# Patient Record
Sex: Male | Born: 1975 | Race: Black or African American | Hispanic: No | Marital: Single | Smoking: Former smoker
Health system: Southern US, Community
[De-identification: ages and names within clinical notes are randomized; demographics above are authoritative.]

## PROBLEM LIST (undated history)

## (undated) DIAGNOSIS — M62838 Other muscle spasm: Secondary | ICD-10-CM

## (undated) DIAGNOSIS — K0889 Other specified disorders of teeth and supporting structures: Secondary | ICD-10-CM

## (undated) DIAGNOSIS — D573 Sickle-cell trait: Secondary | ICD-10-CM

## (undated) DIAGNOSIS — K219 Gastro-esophageal reflux disease without esophagitis: Secondary | ICD-10-CM

## (undated) DIAGNOSIS — K5900 Constipation, unspecified: Secondary | ICD-10-CM

## (undated) DIAGNOSIS — I639 Cerebral infarction, unspecified: Secondary | ICD-10-CM

## (undated) DIAGNOSIS — K047 Periapical abscess without sinus: Secondary | ICD-10-CM

## (undated) HISTORY — DX: Hypocalcemia: E83.51

## (undated) HISTORY — DX: Other muscle spasm: M62.838

## (undated) HISTORY — DX: Cerebral infarction, unspecified: I63.9

## (undated) HISTORY — DX: Constipation, unspecified: K59.00

---
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
## 2013-03-08 ENCOUNTER — Emergency Department (INDEPENDENT_AMBULATORY_CARE_PROVIDER_SITE_OTHER)
Admission: EM | Admit: 2013-03-08 | Discharge: 2013-03-08 | Disposition: A | Discharge status: Home / Self Care | Payer: Self-pay | Source: Home / Self Care | Attending: Family Medicine | Admitting: Family Medicine

## 2013-03-08 ENCOUNTER — Encounter (HOSPITAL_COMMUNITY): Payer: Self-pay | Admitting: Emergency Medicine

## 2013-03-08 DIAGNOSIS — J019 Acute sinusitis, unspecified: Secondary | ICD-10-CM

## 2013-03-08 DIAGNOSIS — J209 Acute bronchitis, unspecified: Secondary | ICD-10-CM

## 2013-03-08 MED ORDER — LEVOFLOXACIN 500 MG PO TABS: 500.0000 mg | ORAL_TABLET | Freq: Every day | ORAL | Status: DC

## 2013-03-08 NOTE — ED Provider Notes (Signed)
CSN: [id number]     Arrival date & time 03/08/13  1825 History   First MD Initiated Contact with Patient 03/08/13 1955     Chief Complaint  Patient presents with  . URI   (Consider location/radiation/quality/duration/timing/severity/associated sxs/prior Treatment) Patient is a 38 y.o. male presenting with URI. The history is provided by the patient.  URI Presenting symptoms: congestion, cough, fatigue and rhinorrhea   Presenting symptoms: no fever   Severity:  Mild Onset quality:  Gradual Duration:  1 month Progression:  Unchanged Chronicity:  New Risk factors comment:  Smoker   History reviewed. No pertinent past medical history. History reviewed. No pertinent past surgical history. No family history on file. History  Substance Use Topics  . Smoking status: Current Every Day Smoker -- 1.00 packs/day    Types: Cigarettes  . Smokeless tobacco: Not on file  . Alcohol Use: Yes    Review of Systems  Constitutional: Positive for fatigue. Negative for fever.  HENT: Positive for congestion and rhinorrhea.   Respiratory: Positive for cough.   Cardiovascular: Negative.   Gastrointestinal: Negative.     Allergies  Review of patient's allergies indicates no known allergies.  Home Medications   Current Outpatient Rx  Name  Route  Sig  Dispense  Refill  . levofloxacin (LEVAQUIN) 500 MG tablet   Oral   Take 1 tablet (500 mg total) by mouth daily.   7 tablet   0    BP 136/90  Pulse 82  Temp(Src) 98.1 F (36.7 C) (Oral)  Resp 18  SpO2 97% Physical Exam  Nursing note and vitals reviewed. Constitutional: He is oriented to person, place, and time. He appears well-developed and well-nourished. No distress.  HENT:  Head: Normocephalic.  Right Ear: External ear normal.  Left Ear: External ear normal.  Mouth/Throat: Oropharynx is clear and moist.  Eyes: Conjunctivae are normal. Pupils are equal, round, and reactive to light.  Neck: Normal range of motion. Neck supple.   Cardiovascular: Normal rate, regular rhythm, normal heart sounds and intact distal pulses.   Pulmonary/Chest: He has decreased breath sounds. He has no wheezes. He has rhonchi. He has no rales.  Neurological: He is alert and oriented to person, place, and time.  Skin: Skin is warm and dry.    ED Course  Procedures (including critical care time) Labs Review Labs Reviewed - No data to display Imaging Review No results found.  EKG Interpretation    Date/Time:    Ventricular Rate:    PR Interval:    QRS Duration:   QT Interval:    QTC Calculation:   R Axis:     Text Interpretation:              MDM      Linna HoffJames D Ellene Bloodsaw, MD 03/08/13 2112

## 2013-03-08 NOTE — ED Notes (Addendum)
Pt c/o cold sxs onset 1 week Sxs include productive cough, SOB, dizziness, nauseas Denies: wheezing, f/v/d Smokes 1 PPD Taking OTC cold meds w/no relief Alert w/no signs of acute distress.

## 2013-03-08 NOTE — Discharge Instructions (Signed)
Take all of medicine, drink lots of fluids, no more smoking, see your doctor if further problems  °

## 2013-08-24 ENCOUNTER — Emergency Department (HOSPITAL_COMMUNITY): Payer: No Typology Code available for payment source

## 2013-08-24 ENCOUNTER — Emergency Department (HOSPITAL_COMMUNITY)
Admission: EM | Admit: 2013-08-24 | Discharge: 2013-08-24 | Disposition: A | Discharge status: Home / Self Care | Payer: No Typology Code available for payment source | Attending: Emergency Medicine | Admitting: Emergency Medicine

## 2013-08-24 ENCOUNTER — Encounter (HOSPITAL_COMMUNITY): Payer: Self-pay | Admitting: Emergency Medicine

## 2013-08-24 DIAGNOSIS — J069 Acute upper respiratory infection, unspecified: Secondary | ICD-10-CM | POA: Insufficient documentation

## 2013-08-24 DIAGNOSIS — K219 Gastro-esophageal reflux disease without esophagitis: Secondary | ICD-10-CM | POA: Insufficient documentation

## 2013-08-24 DIAGNOSIS — R51 Headache: Secondary | ICD-10-CM | POA: Insufficient documentation

## 2013-08-24 DIAGNOSIS — R05 Cough: Secondary | ICD-10-CM

## 2013-08-24 DIAGNOSIS — F17200 Nicotine dependence, unspecified, uncomplicated: Secondary | ICD-10-CM | POA: Insufficient documentation

## 2013-08-24 DIAGNOSIS — Z79899 Other long term (current) drug therapy: Secondary | ICD-10-CM | POA: Insufficient documentation

## 2013-08-24 DIAGNOSIS — R42 Dizziness and giddiness: Secondary | ICD-10-CM | POA: Insufficient documentation

## 2013-08-24 DIAGNOSIS — R059 Cough, unspecified: Secondary | ICD-10-CM

## 2013-08-24 DIAGNOSIS — R079 Chest pain, unspecified: Secondary | ICD-10-CM | POA: Insufficient documentation

## 2013-08-24 DIAGNOSIS — Z862 Personal history of diseases of the blood and blood-forming organs and certain disorders involving the immune mechanism: Secondary | ICD-10-CM | POA: Insufficient documentation

## 2013-08-24 HISTORY — DX: Sickle-cell trait: D57.3

## 2013-08-24 HISTORY — DX: Gastro-esophageal reflux disease without esophagitis: K21.9

## 2013-08-24 LAB — CBC WITH DIFFERENTIAL/PLATELET
BASOS ABS: 0 10*3/uL (ref 0.0–0.1)
Basophils Relative: 0 % (ref 0–1)
EOS PCT: 3 % (ref 0–5)
Eosinophils Absolute: 0.2 10*3/uL (ref 0.0–0.7)
HEMATOCRIT: 44 % (ref 39.0–52.0)
Hemoglobin: 15.1 g/dL (ref 13.0–17.0)
LYMPHS PCT: 38 % (ref 12–46)
Lymphs Abs: 1.9 10*3/uL (ref 0.7–4.0)
MCH: 29.8 pg (ref 26.0–34.0)
MCHC: 34.3 g/dL (ref 30.0–36.0)
MCV: 86.8 fL (ref 78.0–100.0)
MONO ABS: 0.5 10*3/uL (ref 0.1–1.0)
Monocytes Relative: 10 % (ref 3–12)
NEUTROS ABS: 2.4 10*3/uL (ref 1.7–7.7)
Neutrophils Relative %: 49 % (ref 43–77)
Platelets: 233 10*3/uL (ref 150–400)
RBC: 5.07 MIL/uL (ref 4.22–5.81)
RDW: 13.7 % (ref 11.5–15.5)
WBC: 5 10*3/uL (ref 4.0–10.5)

## 2013-08-24 LAB — BASIC METABOLIC PANEL
ANION GAP: 12 (ref 5–15)
BUN: 7 mg/dL (ref 6–23)
CHLORIDE: 104 meq/L (ref 96–112)
CO2: 27 mEq/L (ref 19–32)
CREATININE: 0.96 mg/dL (ref 0.50–1.35)
Calcium: 9.2 mg/dL (ref 8.4–10.5)
GFR calc Af Amer: >90 mL/min (ref >90)
GFR calc non Af Amer: >90 mL/min (ref >90)
Glucose, Bld: 94 mg/dL (ref 70–99)
Potassium: 4.1 mEq/L (ref 3.7–5.3)
SODIUM: 143 meq/L (ref 137–147)

## 2013-08-24 MED ORDER — ACETAMINOPHEN 500 MG PO TABS
1000.0000 mg | ORAL_TABLET | Freq: Once | ORAL | Status: AC
  Administered 2013-08-24: 1000 mg via ORAL
  Filled 2013-08-24: qty 2

## 2013-08-24 MED ORDER — BENZONATATE 100 MG PO CAPS: 100.0000 mg | ORAL_CAPSULE | Freq: Three times a day (TID) | ORAL | Status: DC | PRN

## 2013-08-24 MED ORDER — SODIUM CHLORIDE 0.9 % IV BOLUS (SEPSIS)
1000.0000 mL | Freq: Once | INTRAVENOUS | Status: AC
  Administered 2013-08-24: 1000 mL via INTRAVENOUS

## 2013-08-24 NOTE — Discharge Instructions (Signed)
Cough, Adult  A cough is a reflex that helps clear your throat and airways. It can help heal the body or may be a reaction to an irritated airway. A cough may only last 2 or 3 weeks (acute) or may last more than 8 weeks (chronic).  CAUSES Acute cough:  Viral or bacterial infections. Chronic cough:  Infections.  Allergies.  Asthma.  Post-nasal drip.  Smoking.  Heartburn or acid reflux.  Some medicines.  Chronic lung problems (COPD).  Cancer. SYMPTOMS   Cough.  Fever.  Chest pain.  Increased breathing rate.  High-pitched whistling sound when breathing (wheezing).  Colored mucus that you cough up (sputum). TREATMENT   A bacterial cough may be treated with antibiotic medicine.  A viral cough must run its course and will not respond to antibiotics.  Your caregiver may recommend other treatments if you have a chronic cough. HOME CARE INSTRUCTIONS   Only take over-the-counter or prescription medicines for pain, discomfort, or fever as directed by your caregiver. Use cough suppressants only as directed by your caregiver.  Use a cold steam vaporizer or humidifier in your bedroom or home to help loosen secretions.  Sleep in a semi-upright position if your cough is worse at night.  Rest as needed.  Stop smoking if you smoke. SEEK IMMEDIATE MEDICAL CARE IF:   You have pus in your sputum.  Your cough starts to worsen.  You cannot control your cough with suppressants and are losing sleep.  You begin coughing up blood.  You have difficulty breathing.  You develop pain which is getting worse or is uncontrolled with medicine.  You have a fever. MAKE SURE YOU:   Understand these instructions.  Will watch your condition.  Will get help right away if you are not doing well or get worse. Document Released: 07/27/2010 Document Revised: 04/22/2011 Document Reviewed: 07/27/2010 [employer] Patient Information 2015 [employer]. This information is not intended  to replace advice given to you by your health care provider. Make sure you discuss any questions you have with your health care provider.  

## 2013-08-24 NOTE — ED Provider Notes (Signed)
CSN: [id number]     Arrival date & time 08/24/13  0803 History   First MD Initiated Contact with Patient 08/24/13 [phone]     Chief Complaint  Patient presents with  . URI  . Dizziness     (Consider location/radiation/quality/duration/timing/severity/associated sxs/prior Treatment) HPI 38 year old male presents with multiple complaints. His primary complaints appear to be a cough for over a week and dizziness for the last several days. The dizziness he describes as feeling hot and feeling like he cannot pass out. This is most often noted when standing up. He first noticed it when he was mowing the lawn a few days ago. The patient relates he's been having a productive cough for over a week. Several days ago he noticed a couple that were tinged with blood but that is resolved. He felt warm like he had a fever but did not take his temperature. He also been having a posterior headache he describes like a pressure. This comes on whenever he coughs. He also adamantly has chest pressure when he coughs. He is not having any symptoms currently. The dizziness is not present currently. Patient also relates he's been having loose stools for last few days. No vomiting. He also relates that he had an insect bite that he thinks a spider on his foot several weeks ago that is resolving. He also got stung by a bee a few days ago but this is also resolving. No prior hx of DVT. No leg swelling.  Past Medical History  Diagnosis Date  . Sickle cell trait   . Acid reflux    No past surgical history on file. No family history on file. History  Substance Use Topics  . Smoking status: Current Every Day Smoker -- 1.00 packs/day for 13 years    Types: Cigarettes  . Smokeless tobacco: Not on file  . Alcohol Use: 2.4 oz/week    4 Cans of beer per week    Review of Systems  Constitutional: Positive for fever (subjective).  HENT: Positive for congestion (nasal). Negative for sore throat.   Eyes: Negative for  photophobia and visual disturbance.  Respiratory: Positive for cough. Negative for shortness of breath.   Cardiovascular: Positive for chest pain. Negative for leg swelling.  Gastrointestinal: Negative for vomiting and abdominal distention.  Neurological: Positive for light-headedness and headaches. Negative for syncope.  All other systems reviewed and are negative.     Allergies  Review of patient's allergies indicates no known allergies.  Home Medications   Prior to Admission medications   Medication Sig Start Date End Date Taking? Authorizing Provider  bismuth subsalicylate (PEPTO BISMOL) 262 MG/15ML suspension Take 30 mLs by mouth every 6 (six) hours as needed for indigestion or diarrhea or loose stools.   Yes Historical Provider, MD  calcium carbonate (TUMS - DOSED IN MG ELEMENTAL CALCIUM) 500 MG chewable tablet Chew 0.5-1 tablets by mouth daily as needed for indigestion or heartburn.   Yes Historical Provider, MD   BP 131/84  Pulse 57  Temp(Src) 98.3 F (36.8 C) (Oral)  Resp 20  Ht 5\' 9"  (1.753 m)  Wt 180 lb (81.647 kg)  BMI 26.57 kg/m2  SpO2 98% Physical Exam  Nursing note and vitals reviewed. Constitutional: He is oriented to person, place, and time. He appears well-developed and well-nourished. No distress.  HENT:  Head: Normocephalic and atraumatic.  Right Ear: External ear normal.  Left Ear: External ear normal.  Nose: Nose normal.  No tenderness to scalp or neck  Eyes:  EOM are normal. Pupils are equal, round, and reactive to light. Right eye exhibits no discharge. Left eye exhibits no discharge.  Neck: Normal range of motion. Neck supple.  Cardiovascular: Normal rate, regular rhythm, normal heart sounds and intact distal pulses.   Pulmonary/Chest: Effort normal and breath sounds normal.  Abdominal: Soft. There is no tenderness.  Musculoskeletal: He exhibits no edema.  Neurological: He is alert and oriented to person, place, and time.  CN 2-12 grossly intact.  5/5 strength in all 4 extremities. Normal finger to nose testing. Normal gross sensation diffusely  Skin: Skin is warm and dry.       ED Course  Procedures (including critical care time) Labs Review Labs Reviewed  CBC WITH DIFFERENTIAL  BASIC METABOLIC PANEL    Imaging Review Dg Chest 2 View  08/24/2013   CLINICAL DATA:  Cough, smoker  EXAM: CHEST  2 VIEW  COMPARISON:  None.  FINDINGS: Lungs essentially clear. Mildly scarring in the right lower lung. No pleural effusion or pneumothorax.  The heart is normal in size.  Visualized osseous structures are within normal limits.  IMPRESSION: No evidence of acute cardiopulmonary disease.   Electronically Signed   By: Charline BillsSriyesh  Krishnan M.D.   On: 08/24/2013 10:00     EKG Interpretation   Date/Time:  Tuesday August 24 2013 08:16:08 EDT Ventricular Rate:  70 PR Interval:  152 QRS Duration: 87 QT Interval:  393 QTC Calculation: 424 R Axis:   16 Text Interpretation:  Sinus arrhythmia Early repolarization No old tracing  to compare Confirmed by Ashtan Girtman  MD, Devan Babino ([id number]) on 08/24/2013 9:02:41 AM      MDM   Final diagnoses:  Cough  URI (upper respiratory infection)  Dizziness    Patient's symptoms are not c/w PE, ACS or dissection. His CP appears related to his coughing. Headache appears benign, has normal neuro exam, intermittent symptoms only when coughing and gradual onset and offset. Low suspicion for serious intracranial pathology. CXR unremarkable. Labs unremarkable. Dizziness likely related to his URI and/or diarrhea as he is orthostatic on exam by HR. Given IV fluids, encouraged increased fluid intake at home. His exam is otherwise unconcerning. Counseled on smoking cessation    Audree CamelScott T Damel Querry, MD 08/24/13 1041

## 2013-08-24 NOTE — ED Notes (Signed)
IV removed from rt arm, tape applied with gauze.

## 2013-08-24 NOTE — ED Notes (Signed)
Dizziness constantly since Friday, reports upper congestions as well as coughing that makes his dizziness worse. Reports loose watery stools and gas that causes him to be incontinent of stool at times for about a week.  Also c/o of indigestion.  Productive cough with clear and blood tinged at first but not currently.

## 2013-11-08 ENCOUNTER — Emergency Department (HOSPITAL_COMMUNITY)
Admission: EM | Admit: 2013-11-08 | Discharge: 2013-11-08 | Disposition: A | Discharge status: Home / Self Care | Payer: No Typology Code available for payment source | Attending: Emergency Medicine | Admitting: Emergency Medicine

## 2013-11-08 ENCOUNTER — Emergency Department (HOSPITAL_COMMUNITY): Payer: No Typology Code available for payment source

## 2013-11-08 ENCOUNTER — Encounter (HOSPITAL_COMMUNITY): Payer: Self-pay | Admitting: Emergency Medicine

## 2013-11-08 DIAGNOSIS — X503XXA Overexertion from repetitive movements, initial encounter: Secondary | ICD-10-CM | POA: Insufficient documentation

## 2013-11-08 DIAGNOSIS — F17200 Nicotine dependence, unspecified, uncomplicated: Secondary | ICD-10-CM | POA: Diagnosis not present

## 2013-11-08 DIAGNOSIS — S298XXA Other specified injuries of thorax, initial encounter: Secondary | ICD-10-CM | POA: Diagnosis present

## 2013-11-08 DIAGNOSIS — X500XXA Overexertion from strenuous movement or load, initial encounter: Secondary | ICD-10-CM | POA: Diagnosis not present

## 2013-11-08 DIAGNOSIS — Y9389 Activity, other specified: Secondary | ICD-10-CM | POA: Insufficient documentation

## 2013-11-08 DIAGNOSIS — K219 Gastro-esophageal reflux disease without esophagitis: Secondary | ICD-10-CM | POA: Insufficient documentation

## 2013-11-08 DIAGNOSIS — Y9289 Other specified places as the place of occurrence of the external cause: Secondary | ICD-10-CM | POA: Diagnosis not present

## 2013-11-08 DIAGNOSIS — Y990 Civilian activity done for income or pay: Secondary | ICD-10-CM | POA: Diagnosis not present

## 2013-11-08 DIAGNOSIS — Z862 Personal history of diseases of the blood and blood-forming organs and certain disorders involving the immune mechanism: Secondary | ICD-10-CM | POA: Insufficient documentation

## 2013-11-08 DIAGNOSIS — S29011A Strain of muscle and tendon of front wall of thorax, initial encounter: Secondary | ICD-10-CM

## 2013-11-08 DIAGNOSIS — S2341XA Sprain of ribs, initial encounter: Secondary | ICD-10-CM | POA: Diagnosis not present

## 2013-11-08 MED ORDER — TRAMADOL HCL 50 MG PO TABS: 50.0000 mg | ORAL_TABLET | Freq: Four times a day (QID) | ORAL | Status: DC | PRN

## 2013-11-08 MED ORDER — IBUPROFEN 400 MG PO TABS
400.0000 mg | ORAL_TABLET | Freq: Once | ORAL | Status: AC
  Administered 2013-11-08: 400 mg via ORAL
  Filled 2013-11-08: qty 1

## 2013-11-08 NOTE — ED Notes (Signed)
Declined W/C at D/C and was escorted to lobby by RN. 

## 2013-11-08 NOTE — ED Provider Notes (Signed)
CSN: [id number]     Arrival date & time 11/08/13  1100 History   First MD Initiated Contact with Patient 11/08/13 1156     Chief Complaint  Patient presents with  . Chest Injury     (Consider location/radiation/quality/duration/timing/severity/associated sxs/prior Treatment) The history is provided by the patient.  pt states was at work this morning, lifting a box of melons, when felt sudden pop/pull, and pain right lower chest just left of midline.  Pain constant since. Dull. Moderate. Worse w palpation and certain movements. No sob. No other recent cp or discomfort even w exertion. No associated nv, diaphoresis or sob. No cough or uri c/o. No fever or chills. No pleuritic pain.      Past Medical History  Diagnosis Date  . Sickle cell trait   . Acid reflux    History reviewed. No pertinent past surgical history. No family history on file. History  Substance Use Topics  . Smoking status: Current Every Day Smoker -- 1.00 packs/day for 13 years    Types: Cigarettes  . Smokeless tobacco: Not on file  . Alcohol Use: 2.4 oz/week    4 Cans of beer per week    Review of Systems  Constitutional: Negative for fever.  HENT: Negative for sore throat.   Eyes: Negative for redness.  Respiratory: Negative for shortness of breath.   Cardiovascular: Negative for leg swelling.  Gastrointestinal: Negative for vomiting and abdominal pain.  Genitourinary: Negative for flank pain.  Musculoskeletal: Negative for back pain and neck pain.  Skin: Negative for rash.  Neurological: Negative for headaches.  Hematological: Does not bruise/bleed easily.  Psychiatric/Behavioral: Negative for confusion.      Allergies  Review of patient's allergies indicates no known allergies.  Home Medications   Prior to Admission medications   Medication Sig Start Date End Date Taking? Authorizing Provider  benzonatate (TESSALON) 100 MG capsule Take 1 capsule (100 mg total) by mouth 3 (three) times daily  as needed for cough. 08/24/13   Audree Camel, MD  bismuth subsalicylate (PEPTO BISMOL) 262 MG/15ML suspension Take 30 mLs by mouth every 6 (six) hours as needed for indigestion or diarrhea or loose stools.    Historical Provider, MD  calcium carbonate (TUMS - DOSED IN MG ELEMENTAL CALCIUM) 500 MG chewable tablet Chew 0.5-1 tablets by mouth daily as needed for indigestion or heartburn.    Historical Provider, MD   BP 130/66  Pulse 65  Temp(Src) 98.6 F (37 C) (Oral)  Resp 16  Ht  (1.753 m)  Wt 185 lb (83.915 kg)  BMI 27.31 kg/m2  SpO2 97% Physical Exam  Nursing note and vitals reviewed. Constitutional: He is oriented to person, place, and time. He appears well-developed and well-nourished. No distress.  HENT:  Mouth/Throat: Oropharynx is clear and moist.  Eyes: Conjunctivae are normal.  Neck: Neck supple. No tracheal deviation present.  Cardiovascular: Normal rate, regular rhythm, normal heart sounds and intact distal pulses.  Exam reveals no gallop and no friction rub.   No murmur heard. Pulmonary/Chest: Effort normal and breath sounds normal. No accessory muscle usage. No respiratory distress. He exhibits tenderness.  Localized chest wall tenderness reproducing pts symptoms. Normal chest wall movement. No crepitus.    Abdominal: Soft. Bowel sounds are normal. He exhibits no distension. There is no tenderness.  Musculoskeletal: Normal range of motion. He exhibits no edema and no tenderness.  Neurological: He is alert and oriented to person, place, and time.  Skin: Skin is warm and  dry. He is not diaphoretic.  Psychiatric: He has a normal mood and affect.    ED Course  Procedures (including critical care time) Labs Review  Dg Chest 2 View  11/08/2013   CLINICAL DATA:  Lifting today and felt a pull in the upper chest. Shortness of breath while bending. History of smoking.  EXAM: CHEST  2 VIEW  COMPARISON:  08/24/2013  FINDINGS: The heart size and mediastinal contours are  within normal limits. Both lungs are clear. The visualized skeletal structures are unremarkable.  IMPRESSION: No active cardiopulmonary disease.   Electronically Signed   By: Rosalie Gums M.D.   On: 11/08/2013 12:23     MDM   Motrin po.  Cxr.  Reviewed nursing notes and prior charts for additional history.   Recheck pt comfortable and stable for d/c.  Discussed xrays and d/c plan w pt.     Suzi Roots, MD 11/09/13 1126

## 2013-11-08 NOTE — ED Notes (Signed)
Pt currently in xray

## 2013-11-08 NOTE — Discharge Instructions (Signed)
It was our pleasure to provide your ER care today - we hope that you feel better.  Take motrin or aleve as need for pain. You may also take ultram as need for pain - no driving when taking ultram. Follow up with primary care doctor in 1-2 weeks if symptoms fail to improve/resolve.  Return to ER if worse, new symptoms, trouble breathing, other concern.      Chest Wall Pain Chest wall pain is pain in or around the bones and muscles of your chest. It may take up to 6 weeks to get better. It may take longer if you must stay physically active in your work and activities.  CAUSES  Chest wall pain may happen on its own. However, it may be caused by:  A viral illness like the flu.  Injury.  Coughing.  Exercise.  Arthritis.  Fibromyalgia.  Shingles. HOME CARE INSTRUCTIONS   Avoid overtiring physical activity. Try not to strain or perform activities that cause pain. This includes any activities using your chest or your abdominal and side muscles, especially if heavy weights are used.  Put ice on the sore area.  Put ice in a plastic bag.  Place a towel between your skin and the bag.  Leave the ice on for 15-20 minutes per hour while awake for the first 2 days.  Only take over-the-counter or prescription medicines for pain, discomfort, or fever as directed by your caregiver. SEEK IMMEDIATE MEDICAL CARE IF:   Your pain increases, or you are very uncomfortable.  You have a fever.  Your chest pain becomes worse.  You have new, unexplained symptoms.  You have nausea or vomiting.  You feel sweaty or lightheaded.  You have a cough with phlegm (sputum), or you cough up blood. MAKE SURE YOU:   Understand these instructions.  Will watch your condition.  Will get help right away if you are not doing well or get worse. Document Released: 01/28/2005 Document Revised: 04/22/2011 Document Reviewed: 09/24/2010 [facility] Patient Information 2015 [place], [place]. This information  is not intended to replace advice given to you by your health care provider. Make sure you discuss any questions you have with your health care provider.     Costochondritis Costochondritis, sometimes called Tietze syndrome, is a swelling and irritation (inflammation) of the tissue (cartilage) that connects your ribs with your breastbone (sternum). It causes pain in the chest and rib area. Costochondritis usually goes away on its own over time. It can take up to 6 weeks or longer to get better, especially if you are unable to limit your activities. CAUSES  Some cases of costochondritis have no known cause. Possible causes include:  Injury (trauma).  Exercise or activity such as lifting.  Severe coughing. SIGNS AND SYMPTOMS  Pain and tenderness in the chest and rib area.  Pain that gets worse when coughing or taking deep breaths.  Pain that gets worse with specific movements. DIAGNOSIS  Your health care provider will do a physical exam and ask about your symptoms. Chest X-rays or other tests may be done to rule out other problems. TREATMENT  Costochondritis usually goes away on its own over time. Your health care provider may prescribe medicine to help relieve pain. HOME CARE INSTRUCTIONS   Avoid exhausting physical activity. Try not to strain your ribs during normal activity. This would include any activities using chest, abdominal, and side muscles, especially if heavy weights are used.  Apply ice to the affected area for the first 2 days  after the pain begins.  Put ice in a plastic bag.  Place a towel between your skin and the bag.  Leave the ice on for 20 minutes, 2-3 times a day.  Only take over-the-counter or prescription medicines as directed by your health care provider. SEEK MEDICAL CARE IF:  You have redness or swelling at the rib joints. These are signs of infection.  Your pain does not go away despite rest or medicine. SEEK IMMEDIATE MEDICAL CARE IF:   Your  pain increases or you are very uncomfortable.  You have shortness of breath or difficulty breathing.  You cough up blood.  You have worse chest pains, sweating, or vomiting.  You have a fever or persistent symptoms for more than 2-3 days.  You have a fever and your symptoms suddenly get worse. MAKE SURE YOU:   Understand these instructions.  Will watch your condition.  Will get help right away if you are not doing well or get worse. Document Released: 11/07/2004 Document Revised: 11/18/2012 Document Reviewed: 09/01/2012 [facility] Patient Information 2015 [place], [place]. This information is not intended to replace advice given to you by your health care provider. Make sure you discuss any questions you have with your health care provider.

## 2013-11-08 NOTE — ED Notes (Signed)
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 Not sure if he pulled something,.

## 2013-11-08 NOTE — ED Notes (Signed)
Pt was lifting a 40lb object, felt "pop" in right chest. States it now hurts to move and deep breath. Breath sounds present and clear.

## 2014-02-06 ENCOUNTER — Encounter (HOSPITAL_COMMUNITY): Payer: Self-pay | Admitting: Nurse Practitioner

## 2014-02-06 ENCOUNTER — Emergency Department (HOSPITAL_COMMUNITY)
Admission: EM | Admit: 2014-02-06 | Discharge: 2014-02-06 | Disposition: A | Discharge status: Home / Self Care | Payer: No Typology Code available for payment source | Attending: Emergency Medicine | Admitting: Emergency Medicine

## 2014-02-06 DIAGNOSIS — R109 Unspecified abdominal pain: Secondary | ICD-10-CM | POA: Diagnosis present

## 2014-02-06 DIAGNOSIS — Z720 Tobacco use: Secondary | ICD-10-CM | POA: Insufficient documentation

## 2014-02-06 DIAGNOSIS — R1084 Generalized abdominal pain: Secondary | ICD-10-CM | POA: Diagnosis not present

## 2014-02-06 DIAGNOSIS — K219 Gastro-esophageal reflux disease without esophagitis: Secondary | ICD-10-CM | POA: Diagnosis not present

## 2014-02-06 DIAGNOSIS — Z862 Personal history of diseases of the blood and blood-forming organs and certain disorders involving the immune mechanism: Secondary | ICD-10-CM | POA: Insufficient documentation

## 2014-02-06 DIAGNOSIS — R195 Other fecal abnormalities: Secondary | ICD-10-CM | POA: Diagnosis not present

## 2014-02-06 LAB — COMPREHENSIVE METABOLIC PANEL
ALT: 30 U/L (ref 0–53)
ANION GAP: 9 (ref 5–15)
AST: 29 U/L (ref 0–37)
Albumin: 4.7 g/dL (ref 3.5–5.2)
Alkaline Phosphatase: 95 U/L (ref 39–117)
BILIRUBIN TOTAL: 0.3 mg/dL (ref 0.3–1.2)
BUN: 10 mg/dL (ref 6–23)
CHLORIDE: 102 meq/L (ref 96–112)
CO2: 26 mmol/L (ref 19–32)
CREATININE: 0.98 mg/dL (ref 0.50–1.35)
Calcium: 9.9 mg/dL (ref 8.4–10.5)
GLUCOSE: 99 mg/dL (ref 70–99)
Potassium: 4.4 mmol/L (ref 3.5–5.1)
Sodium: 137 mmol/L (ref 135–145)
Total Protein: 7.7 g/dL (ref 6.0–8.3)

## 2014-02-06 LAB — URINALYSIS, ROUTINE W REFLEX MICROSCOPIC
Bilirubin Urine: NEGATIVE
Glucose, UA: NEGATIVE mg/dL
Hgb urine dipstick: NEGATIVE
KETONES UR: NEGATIVE mg/dL
LEUKOCYTES UA: NEGATIVE
Nitrite: NEGATIVE
PROTEIN: NEGATIVE mg/dL
Specific Gravity, Urine: 1.018 (ref 1.005–1.030)
UROBILINOGEN UA: 0.2 mg/dL (ref 0.0–1.0)
pH: 6 (ref 5.0–8.0)

## 2014-02-06 LAB — CBC WITH DIFFERENTIAL/PLATELET
Basophils Absolute: 0 10*3/uL (ref 0.0–0.1)
Basophils Relative: 0 % (ref 0–1)
Eosinophils Absolute: 0.1 10*3/uL (ref 0.0–0.7)
Eosinophils Relative: 2 % (ref 0–5)
HCT: 46.1 % (ref 39.0–52.0)
HEMOGLOBIN: 16.2 g/dL (ref 13.0–17.0)
LYMPHS ABS: 2.5 10*3/uL (ref 0.7–4.0)
LYMPHS PCT: 39 % (ref 12–46)
MCH: 29.6 pg (ref 26.0–34.0)
MCHC: 35.1 g/dL (ref 30.0–36.0)
MCV: 84.1 fL (ref 78.0–100.0)
MONOS PCT: 9 % (ref 3–12)
Monocytes Absolute: 0.6 10*3/uL (ref 0.1–1.0)
NEUTROS ABS: 3.3 10*3/uL (ref 1.7–7.7)
Neutrophils Relative %: 50 % (ref 43–77)
Platelets: 248 10*3/uL (ref 150–400)
RBC: 5.48 MIL/uL (ref 4.22–5.81)
RDW: 13 % (ref 11.5–15.5)
WBC: 6.4 10*3/uL (ref 4.0–10.5)

## 2014-02-06 LAB — POC OCCULT BLOOD, ED: Fecal Occult Bld: NEGATIVE

## 2014-02-06 MED ORDER — OXYCODONE-ACETAMINOPHEN 5-325 MG PO TABS: 1.0000 | ORAL_TABLET | Freq: Four times a day (QID) | ORAL | Status: DC | PRN

## 2014-02-06 MED ORDER — LANSOPRAZOLE 30 MG PO CPDR: 30.0000 mg | DELAYED_RELEASE_CAPSULE | Freq: Every day | ORAL | Status: DC

## 2014-02-06 NOTE — Discharge Instructions (Signed)
Return to the ED with any concerns including vomiting and not able to keep down liquids, worsening abdominal pain, fever/chills, fainting, decreased level of alertness/lethargy, or any other alarming symptoms °

## 2014-02-06 NOTE — ED Provider Notes (Signed)
CSN: [id number]     Arrival date & time 02/06/14  1647 History   First MD Initiated Contact with Patient 02/06/14 1849     Chief Complaint  Patient presents with  . Abdominal Pain     (Consider location/radiation/quality/duration/timing/severity/associated sxs/prior Treatment) HPI  Pt with hx of GERD reports symptoms of abdominal pain, bloating, gas as well as dark colored stools.  He states the change in stools occurred today- took pepto bismol for the past 2 days.  No fever, no vomiting.  No bright red blood in stool.  Pt setates he has taken multiple meds- tums, rolaids, bean, nexium all wtihout relief of symptoms.   No fever/chills.  Denies dysuria.  There are no other associated systemic symptoms, there are no other alleviating or modifying factors.   Past Medical History  Diagnosis Date  . Sickle cell trait   . Acid reflux    History reviewed. No pertinent past surgical history. History reviewed. No pertinent family history. History  Substance Use Topics  . Smoking status: Current Every Day Smoker -- 1.00 packs/day for 13 years    Types: Cigarettes  . Smokeless tobacco: Not on file  . Alcohol Use: 2.4 oz/week    4 Cans of beer per week    Review of Systems  ROS reviewed and all otherwise negative except for mentioned in HPI    Allergies  Review of patient's allergies indicates no known allergies.  Home Medications   Prior to Admission medications   Medication Sig Start Date End Date Taking? Authorizing Provider  lansoprazole (PREVACID) 30 MG capsule Take 1 capsule (30 mg total) by mouth daily at 12 noon. 02/06/14   Ethelda ChickMartha K Linker, MD  oxyCODONE-acetaminophen (PERCOCET/ROXICET) 5-325 MG per tablet Take 1-2 tablets by mouth every 6 (six) hours as needed for severe pain. 02/06/14   Ethelda ChickMartha K Linker, MD  traMADol (ULTRAM) 50 MG tablet Take 1 tablet (50 mg total) by mouth every 6 (six) hours as needed. Patient not taking: Reported on 02/06/2014 11/08/13   Suzi RootsKevin E Steinl,  MD   BP 130/75 mmHg  Pulse 68  Temp(Src) 98.2 F (36.8 C) (Oral)  Resp 17  Ht 5\' 10"  (1.778 m)  Wt 183 lb (83.008 kg)  BMI 26.26 kg/m2  SpO2 97%  Vitals reviewed Physical Exam  Physical Examination: General appearance - alert, well appearing, and in no distress Mental status - alert, oriented to person, place, and time Eyes - no conjunctival injection, no scleral icterus Mouth - mucous membranes moist, pharynx normal without lesions Chest - clear to auscultation, no wheezes, rales or rhonchi, symmetric air entry Heart - normal rate, regular rhythm, normal S1, S2, no murmurs, rubs, clicks or gallops Abdomen - soft, mild diffuse tenderness to palpation, no gaurding or rebound, nondistended, no masses or organomegaly Extremities - peripheral pulses normal, no pedal edema, no clubbing or cyanosis Skin - normal coloration and turgor, no rashes  ED Course  Procedures (including critical care time) Labs Review Labs Reviewed  CBC WITH DIFFERENTIAL  COMPREHENSIVE METABOLIC PANEL  URINALYSIS, ROUTINE W REFLEX MICROSCOPIC  POC OCCULT BLOOD, ED    Imaging Review No results found.   EKG Interpretation None      MDM   Final diagnoses:  Generalized abdominal pain    Pt presenting with diffuse abdominal pain, no vomiting or diarrhea.  hemocult negative stool- dark color likely related to pepto bismol use.  Abdominal exam is benign.  No indication for imaging on an emergent basis.  Pt  given rx for prevacid and advised GI followup.  Discharged with strict return precautions.  Pt agreeable with plan.    Ethelda ChickMartha K Linker, MD 02/10/14 [phone]

## 2014-02-06 NOTE — ED Notes (Signed)
Pt a/o x 4 on d/c with friend. 

## 2014-02-06 NOTE — ED Notes (Signed)
Pt reports abd pain, bloating, gas and dark colored stools over past month. He took laxatives, nexium, pepto bismol, beano, rolaids with no relief of symptoms. Reports nausea but no vomiting

## 2014-03-07 ENCOUNTER — Emergency Department (HOSPITAL_COMMUNITY): Payer: No Typology Code available for payment source

## 2014-03-07 ENCOUNTER — Encounter (HOSPITAL_COMMUNITY): Payer: Self-pay | Admitting: *Deleted

## 2014-03-07 ENCOUNTER — Emergency Department (HOSPITAL_COMMUNITY)
Admission: EM | Admit: 2014-03-07 | Discharge: 2014-03-07 | Disposition: A | Discharge status: Home / Self Care | Payer: No Typology Code available for payment source | Attending: Emergency Medicine | Admitting: Emergency Medicine

## 2014-03-07 DIAGNOSIS — Z720 Tobacco use: Secondary | ICD-10-CM | POA: Insufficient documentation

## 2014-03-07 DIAGNOSIS — R079 Chest pain, unspecified: Secondary | ICD-10-CM | POA: Insufficient documentation

## 2014-03-07 DIAGNOSIS — K219 Gastro-esophageal reflux disease without esophagitis: Secondary | ICD-10-CM | POA: Diagnosis not present

## 2014-03-07 DIAGNOSIS — Z862 Personal history of diseases of the blood and blood-forming organs and certain disorders involving the immune mechanism: Secondary | ICD-10-CM | POA: Insufficient documentation

## 2014-03-07 DIAGNOSIS — R51 Headache: Secondary | ICD-10-CM | POA: Insufficient documentation

## 2014-03-07 DIAGNOSIS — R0602 Shortness of breath: Secondary | ICD-10-CM | POA: Diagnosis present

## 2014-03-07 DIAGNOSIS — Z79891 Long term (current) use of opiate analgesic: Secondary | ICD-10-CM | POA: Diagnosis not present

## 2014-03-07 DIAGNOSIS — R05 Cough: Secondary | ICD-10-CM | POA: Insufficient documentation

## 2014-03-07 DIAGNOSIS — R0981 Nasal congestion: Secondary | ICD-10-CM | POA: Insufficient documentation

## 2014-03-07 DIAGNOSIS — Z79899 Other long term (current) drug therapy: Secondary | ICD-10-CM | POA: Diagnosis not present

## 2014-03-07 DIAGNOSIS — R109 Unspecified abdominal pain: Secondary | ICD-10-CM | POA: Diagnosis not present

## 2014-03-07 LAB — I-STAT TROPONIN, ED: Troponin i, poc: 0 ng/mL (ref 0.00–0.08)

## 2014-03-07 LAB — COMPREHENSIVE METABOLIC PANEL
ALT: 31 U/L (ref 0–53)
ANION GAP: 5 (ref 5–15)
AST: 32 U/L (ref 0–37)
Albumin: 4 g/dL (ref 3.5–5.2)
Alkaline Phosphatase: 92 U/L (ref 39–117)
BUN: 8 mg/dL (ref 6–23)
CALCIUM: 9.4 mg/dL (ref 8.4–10.5)
CO2: 31 mmol/L (ref 19–32)
CREATININE: 1.03 mg/dL (ref 0.50–1.35)
Chloride: 106 mmol/L (ref 96–112)
GFR calc non Af Amer: >90 mL/min (ref >90)
GLUCOSE: 111 mg/dL — AB (ref 70–99)
Potassium: 4.2 mmol/L (ref 3.5–5.1)
SODIUM: 142 mmol/L (ref 135–145)
TOTAL PROTEIN: 6.8 g/dL (ref 6.0–8.3)
Total Bilirubin: 0.6 mg/dL (ref 0.3–1.2)

## 2014-03-07 LAB — CBC WITH DIFFERENTIAL/PLATELET
BASOS ABS: 0 10*3/uL (ref 0.0–0.1)
BASOS PCT: 1 % (ref 0–1)
EOS PCT: 3 % (ref 0–5)
Eosinophils Absolute: 0.2 10*3/uL (ref 0.0–0.7)
HCT: 43.4 % (ref 39.0–52.0)
Hemoglobin: 15.1 g/dL (ref 13.0–17.0)
LYMPHS PCT: 41 % (ref 12–46)
Lymphs Abs: 2.2 10*3/uL (ref 0.7–4.0)
MCH: 29.6 pg (ref 26.0–34.0)
MCHC: 34.8 g/dL (ref 30.0–36.0)
MCV: 85.1 fL (ref 78.0–100.0)
Monocytes Absolute: 0.7 10*3/uL (ref 0.1–1.0)
Monocytes Relative: 13 % — ABNORMAL HIGH (ref 3–12)
Neutro Abs: 2.4 10*3/uL (ref 1.7–7.7)
Neutrophils Relative %: 42 % — ABNORMAL LOW (ref 43–77)
Platelets: 223 10*3/uL (ref 150–400)
RBC: 5.1 MIL/uL (ref 4.22–5.81)
RDW: 13.1 % (ref 11.5–15.5)
WBC: 5.5 10*3/uL (ref 4.0–10.5)

## 2014-03-07 LAB — LIPASE, BLOOD: Lipase: 38 U/L (ref 11–59)

## 2014-03-07 NOTE — ED Notes (Signed)
The pt is c/o numerus problems.  He has a cold head pain and congestion.  He has chest congestion with a non-productive cough for 3 days. He ate a hot dog tonight while watching and he has a burning in his  abd and chest.  He cannot breathe when he lies down.  Sob.  None now.  He is a smoker .  No asthma.    Burping and very much gas today

## 2014-03-07 NOTE — Discharge Instructions (Signed)
Chest Pain (Nonspecific) °It is often hard to give a specific diagnosis for the cause of chest pain. There is always a chance that your pain could be related to something serious, such as a heart attack or a blood clot in the lungs. You need to follow up with your health care provider for further evaluation. °CAUSES  °· Heartburn. °· Pneumonia or bronchitis. °· Anxiety or stress. °· Inflammation around your heart (pericarditis) or lung (pleuritis or pleurisy). °· A blood clot in the lung. °· A collapsed lung (pneumothorax). It can develop suddenly on its own (spontaneous pneumothorax) or from trauma to the chest. °· Shingles infection (herpes zoster virus). °The chest wall is composed of bones, muscles, and cartilage. Any of these can be the source of the pain. °· The bones can be bruised by injury. °· The muscles or cartilage can be strained by coughing or overwork. °· The cartilage can be affected by inflammation and become sore (costochondritis). °DIAGNOSIS  °Lab tests or other studies may be needed to find the cause of your pain. Your health care provider may have you take a test called an ambulatory electrocardiogram (ECG). An ECG records your heartbeat patterns over a 24-hour period. You may also have other tests, such as: °· Transthoracic echocardiogram (TTE). During echocardiography, sound waves are used to evaluate how blood flows through your heart. °· Transesophageal echocardiogram (TEE). °· Cardiac monitoring. This allows your health care provider to monitor your heart rate and rhythm in real time. °· Holter monitor. This is a portable device that records your heartbeat and can help diagnose heart arrhythmias. It allows your health care provider to track your heart activity for several days, if needed. °· Stress tests by exercise or by giving medicine that makes the heart beat faster. °TREATMENT  °· Treatment depends on what may be causing your chest pain. Treatment may include: °· Acid blockers for  heartburn. °· Anti-inflammatory medicine. °· Pain medicine for inflammatory conditions. °· Antibiotics if an infection is present. °· You may be advised to change lifestyle habits. This includes stopping smoking and avoiding alcohol, caffeine, and chocolate. °· You may be advised to keep your head raised (elevated) when sleeping. This reduces the chance of acid going backward from your stomach into your esophagus. °Most of the time, nonspecific chest pain will improve within 2-3 days with rest and mild pain medicine.  °HOME CARE INSTRUCTIONS  °· If antibiotics were prescribed, take them as directed. Finish them even if you start to feel better. °· For the next few days, avoid physical activities that bring on chest pain. Continue physical activities as directed. °· Do not use any tobacco products, including cigarettes, chewing tobacco, or electronic cigarettes. °· Avoid drinking alcohol. °· Only take medicine as directed by your health care provider. °· Follow your health care provider's suggestions for further testing if your chest pain does not go away. °· Keep any follow-up appointments you made. If you do not go to an appointment, you could develop lasting (chronic) problems with pain. If there is any problem keeping an appointment, call to reschedule. °SEEK MEDICAL CARE IF:  °· Your chest pain does not go away, even after treatment. °· You have a rash with blisters on your chest. °· You have a fever. °SEEK IMMEDIATE MEDICAL CARE IF:  °· You have increased chest pain or pain that spreads to your arm, neck, jaw, back, or abdomen. °· You have shortness of breath. °· You have an increasing cough, or you cough   up blood. °· You have severe back or abdominal pain. °· You feel nauseous or vomit. °· You have severe weakness. °· You faint. °· You have chills. °This is an emergency. Do not wait to see if the pain will go away. Get medical help at once. Call your local emergency services (911 in [place] Do not drive  yourself to the hospital. °MAKE SURE YOU:  °· Understand these instructions. °· Will watch your condition. °· Will get help right away if you are not doing well or get worse. °Document Released: 11/07/2004 Document Revised: 02/02/2013 Document Reviewed: 09/03/2007 °ExitCare® Patient Information ©2015 [employer]. This information is not intended to replace advice given to you by your health care provider. Make sure you discuss any questions you have with your health care provider. °Gastroesophageal Reflux Disease, Adult °Gastroesophageal reflux disease (GERD) happens when acid from your stomach flows up into the esophagus. When acid comes in contact with the esophagus, the acid causes soreness (inflammation) in the esophagus. Over time, GERD may create small holes (ulcers) in the lining of the esophagus. °CAUSES  °· Increased body weight. This puts pressure on the stomach, making acid rise from the stomach into the esophagus. °· Smoking. This increases acid production in the stomach. °· Drinking alcohol. This causes decreased pressure in the lower esophageal sphincter (valve or ring of muscle between the esophagus and stomach), allowing acid from the stomach into the esophagus. °· Late evening meals and a full stomach. This increases pressure and acid production in the stomach. °· A malformed lower esophageal sphincter. °Sometimes, no cause is found. °SYMPTOMS  °· Burning pain in the lower part of the mid-chest behind the breastbone and in the mid-stomach area. This may occur twice a week or more often. °· Trouble swallowing. °· Sore throat. °· Dry cough. °· Asthma-like symptoms including chest tightness, shortness of breath, or wheezing. °DIAGNOSIS  °Your caregiver may be able to diagnose GERD based on your symptoms. In some cases, X-rays and other tests may be done to check for complications or to check the condition of your stomach and esophagus. °TREATMENT  °Your caregiver may recommend over-the-counter or  prescription medicines to help decrease acid production. Ask your caregiver before starting or adding any new medicines.  °HOME CARE INSTRUCTIONS  °· Change the factors that you can control. Ask your caregiver for guidance concerning weight loss, quitting smoking, and alcohol consumption. °· Avoid foods and drinks that make your symptoms worse, such as: °¨ Caffeine or alcoholic drinks. °¨ Chocolate. °¨ Peppermint or mint flavorings. °¨ Garlic and onions. °¨ Spicy foods. °¨ Citrus fruits, such as oranges, lemons, or limes. °¨ Tomato-based foods such as sauce, chili, salsa, and pizza. °¨ Fried and fatty foods. °· Avoid lying down for the 3 hours prior to your bedtime or prior to taking a nap. °· Eat small, frequent meals instead of large meals. °· Wear loose-fitting clothing. Do not wear anything tight around your waist that causes pressure on your stomach. °· Raise the head of your bed 6 to 8 inches with wood blocks to help you sleep. Extra pillows will not help. °· Only take over-the-counter or prescription medicines for pain, discomfort, or fever as directed by your caregiver. °· Do not take aspirin, ibuprofen, or other nonsteroidal anti-inflammatory drugs (NSAIDs). °SEEK IMMEDIATE MEDICAL CARE IF:  °· You have pain in your arms, neck, jaw, teeth, or back. °· Your pain increases or changes in intensity or duration. °· You develop nausea, vomiting, or sweating (diaphoresis). °·   You develop shortness of breath, or you faint. °· Your vomit is green, yellow, black, or looks like coffee grounds or blood. °· Your stool is red, bloody, or black. °These symptoms could be signs of other problems, such as heart disease, gastric bleeding, or esophageal bleeding. °MAKE SURE YOU:  °· Understand these instructions. °· Will watch your condition. °· Will get help right away if you are not doing well or get worse. °Document Released: 11/07/2004 Document Revised: 04/22/2011 Document Reviewed: 08/17/2010 °ExitCare® Patient  Information ©2015 [employer]. This information is not intended to replace advice given to you by your health care provider. Make sure you discuss any questions you have with your health care provider. ° °

## 2014-03-07 NOTE — ED Provider Notes (Signed)
CSN: [id number]     Arrival date & time 03/07/14  4098 History  This chart was scribed for [facility] R. Rubin Payor, MD by Annye Asa, ED Scribe. This patient was seen in room B15C/B15C and the patient's care was started at 3:45 AM.     Chief Complaint  Patient presents with  . multiple complaints    The history is provided by the patient. No language interpreter was used.    HPI Comments: Hector Wilcox is a 39 y.o. male with past medical history of sleep apnea who presents to the Emergency Department complaining of sinus congestion, slightly productive cough, and a "burning" sensation in his abdomen, chest and throat. He reports, beginning tonight, he will wake from sleep with sudden onset gas and burping; burping improves his symptoms but only momentarily. His headache and sinus congestion is only noticeable with sneezing. He explains that he has taken Pepto Bismol and TUMS without relief. He denies fevers.   Patient states he quit smoking 1 week PTA.   Past Medical History  Diagnosis Date  . Sickle cell trait   . Acid reflux    History reviewed. No pertinent past surgical history. No family history on file. History  Substance Use Topics  . Smoking status: Current Every Day Smoker -- 1.00 packs/day for 13 years    Types: Cigarettes  . Smokeless tobacco: Not on file  . Alcohol Use: 2.4 oz/week    4 Cans of beer per week    Review of Systems  HENT: Positive for congestion.   Respiratory: Positive for cough and shortness of breath.   All other systems reviewed and are negative.   Allergies  Review of patient's allergies indicates no known allergies.  Home Medications   Prior to Admission medications   Medication Sig Start Date End Date Taking? Authorizing Provider  bismuth subsalicylate (PEPTO BISMOL) 262 MG/15ML suspension Take 30 mLs by mouth every 6 (six) hours as needed for indigestion.   Yes Historical Provider, MD  calcium carbonate (TUMS - DOSED IN MG ELEMENTAL  CALCIUM) 500 MG chewable tablet Chew 1-2 tablets by mouth every 3 (three) hours as needed for indigestion or heartburn.   Yes Historical Provider, MD  lansoprazole (PREVACID) 30 MG capsule Take 1 capsule (30 mg total) by mouth daily at 12 noon. 02/06/14  Yes Ethelda Chick, MD  oxyCODONE-acetaminophen (PERCOCET/ROXICET) 5-325 MG per tablet Take 1-2 tablets by mouth every 6 (six) hours as needed for severe pain. 02/06/14  Yes Ethelda Chick, MD  traMADol (ULTRAM) 50 MG tablet Take 1 tablet (50 mg total) by mouth every 6 (six) hours as needed. Patient not taking: Reported on 02/06/2014 11/08/13   Suzi Roots, MD   BP 132/58 mmHg  Pulse 68  Temp(Src) 98.1 F (36.7 C) (Oral)  Resp 16  Ht  (1.753 m)  Wt 183 lb (83.008 kg)  BMI 27.01 kg/m2  SpO2 98% Physical Exam  Constitutional: He is oriented to person, place, and time. He appears well-developed and well-nourished. No distress.  HENT:  Head: Normocephalic and atraumatic.  Mouth/Throat: Oropharynx is clear and moist. No oropharyngeal exudate.  Tonsillar hypertrophy; posterior pharynx is otherwise normal  Eyes: EOM are normal. Pupils are equal, round, and reactive to light.  Neck: Normal range of motion. Neck supple.  Cardiovascular: Normal rate, regular rhythm and normal heart sounds.  Exam reveals no gallop and no friction rub.   No murmur heard. Pulmonary/Chest: Effort normal. No respiratory distress. He has no wheezes.  He has no rales.  Abdominal: Soft. He exhibits no mass. There is tenderness (Mild right side). There is no rebound and no guarding.  Musculoskeletal: Normal range of motion. He exhibits no edema.  Neurological: He is alert and oriented to person, place, and time.  Skin: Skin is warm and dry. No rash noted.  Psychiatric: He has a normal mood and affect. His behavior is normal.  Nursing note and vitals reviewed.   ED Course  Procedures   DIAGNOSTIC STUDIES: Oxygen Saturation is 96% on RA, adequate by my  interpretation.    COORDINATION OF CARE: 3:48 AM Discussed treatment plan with pt at bedside and pt agreed to plan.   Labs Review Labs Reviewed  CBC WITH DIFFERENTIAL/PLATELET - Abnormal; Notable for the following:    Neutrophils Relative % 42 (*)    Monocytes Relative 13 (*)    All other components within normal limits  COMPREHENSIVE METABOLIC PANEL - Abnormal; Notable for the following:    Glucose, Bld 111 (*)    All other components within normal limits  LIPASE, BLOOD  I-STAT TROPOININ, ED    Imaging Review Dg Chest 2 View  03/07/2014   CLINICAL DATA:  With sleep apnea tonight after taking Alka-Seltzer for sinus congestion. Woke up repeatedly gasping for air. Sinus congestion. Productive cough. Burning sensation in the chest and abdomen. Distention for 3 days.  EXAM: CHEST  2 VIEW  COMPARISON:  11/08/2013  FINDINGS: The heart size and mediastinal contours are within normal limits. Both lungs are clear. The visualized skeletal structures are unremarkable.  IMPRESSION: No active cardiopulmonary disease.   Electronically Signed   By: Burman NievesWilliam  Stevens M.D.   On: 03/07/2014 04:12     EKG Interpretation   Date/Time:  Monday March 07 2014 03:23:23 EST Ventricular Rate:  68 PR Interval:  159 QRS Duration: 90 QT Interval:  356 QTC Calculation: 378 R Axis:   70 Text Interpretation:  Sinus arrhythmia Early repolarization Baseline  wander in lead(s) V6 Confirmed by Taelyn Broecker  MD, Tamikka Pilger [phone]([id number]) on  03/07/2014 3:53:49 AM      MDM   Final diagnoses:  SOB (shortness of breath)  Chest pain, unspecified chest pain type   patient with multiple complaints. Shortness of breath headache chest pain always worse at night. Worse and he wakes up not breathing. Has had history of reflux. Lab work and EKG are reassuring. X-ray negative. WILL DISCHARGE HOME WITH GI FOLLOW-UP.  I personally performed the services described in this documentation, which was scribed in my presence. The recorded  information has been reviewed and is accurate.       Juliet RudeNathan R. Rubin PayorPickering, MD 03/07/14 [phone]

## 2014-03-07 NOTE — ED Notes (Signed)
The pt is c/o a headache 

## 2014-03-10 ENCOUNTER — Encounter: Payer: Self-pay | Admitting: Gastroenterology

## 2014-03-10 ENCOUNTER — Telehealth: Payer: Self-pay | Admitting: Gastroenterology

## 2014-03-10 NOTE — Telephone Encounter (Signed)
Left message for pt to call back  °

## 2014-03-16 NOTE — Telephone Encounter (Signed)
Left message for patient to call back  

## 2014-03-17 NOTE — Telephone Encounter (Signed)
Left message on machine to call back  

## 2014-03-22 NOTE — Telephone Encounter (Signed)
Unable to reach pt will wait for further communication  

## 2014-04-26 ENCOUNTER — Ambulatory Visit: Payer: No Typology Code available for payment source | Admitting: Gastroenterology

## 2014-04-29 ENCOUNTER — Encounter: Payer: Self-pay | Admitting: Gastroenterology

## 2014-06-24 ENCOUNTER — Ambulatory Visit: Payer: No Typology Code available for payment source | Admitting: Gastroenterology

## 2014-07-01 ENCOUNTER — Encounter (HOSPITAL_COMMUNITY): Payer: Self-pay | Admitting: Family Medicine

## 2014-07-01 ENCOUNTER — Emergency Department (HOSPITAL_COMMUNITY)
Admission: EM | Admit: 2014-07-01 | Discharge: 2014-07-01 | Disposition: A | Discharge status: Home / Self Care | Payer: Self-pay | Attending: Emergency Medicine | Admitting: Emergency Medicine

## 2014-07-01 DIAGNOSIS — Z79899 Other long term (current) drug therapy: Secondary | ICD-10-CM | POA: Insufficient documentation

## 2014-07-01 DIAGNOSIS — R109 Unspecified abdominal pain: Secondary | ICD-10-CM | POA: Insufficient documentation

## 2014-07-01 DIAGNOSIS — Z862 Personal history of diseases of the blood and blood-forming organs and certain disorders involving the immune mechanism: Secondary | ICD-10-CM | POA: Insufficient documentation

## 2014-07-01 DIAGNOSIS — Z720 Tobacco use: Secondary | ICD-10-CM | POA: Insufficient documentation

## 2014-07-01 LAB — COMPREHENSIVE METABOLIC PANEL
ALBUMIN: 4.2 g/dL (ref 3.5–5.0)
ALT: 31 U/L (ref 17–63)
AST: 28 U/L (ref 15–41)
Alkaline Phosphatase: 72 U/L (ref 38–126)
Anion gap: 8 (ref 5–15)
BILIRUBIN TOTAL: 0.6 mg/dL (ref 0.3–1.2)
BUN: 9 mg/dL (ref 6–20)
CO2: 24 mmol/L (ref 22–32)
Calcium: 9.1 mg/dL (ref 8.9–10.3)
Chloride: 104 mmol/L (ref 101–111)
Creatinine, Ser: 0.92 mg/dL (ref 0.61–1.24)
GFR calc non Af Amer: >60 mL/min (ref >60)
Glucose, Bld: 108 mg/dL — ABNORMAL HIGH (ref 65–99)
POTASSIUM: 4.2 mmol/L (ref 3.5–5.1)
SODIUM: 136 mmol/L (ref 135–145)
Total Protein: 7 g/dL (ref 6.5–8.1)

## 2014-07-01 LAB — CBC WITH DIFFERENTIAL/PLATELET
Basophils Absolute: 0 10*3/uL (ref 0.0–0.1)
Basophils Relative: 0 % (ref 0–1)
EOS ABS: 0.1 10*3/uL (ref 0.0–0.7)
Eosinophils Relative: 1 % (ref 0–5)
HCT: 43.3 % (ref 39.0–52.0)
Hemoglobin: 15.2 g/dL (ref 13.0–17.0)
Lymphocytes Relative: 27 % (ref 12–46)
Lymphs Abs: 1.6 10*3/uL (ref 0.7–4.0)
MCH: 29 pg (ref 26.0–34.0)
MCHC: 35.1 g/dL (ref 30.0–36.0)
MCV: 82.5 fL (ref 78.0–100.0)
Monocytes Absolute: 0.5 10*3/uL (ref 0.1–1.0)
Monocytes Relative: 8 % (ref 3–12)
NEUTROS PCT: 64 % (ref 43–77)
Neutro Abs: 3.8 10*3/uL (ref 1.7–7.7)
PLATELETS: 272 10*3/uL (ref 150–400)
RBC: 5.25 MIL/uL (ref 4.22–5.81)
RDW: 13 % (ref 11.5–15.5)
WBC: 6 10*3/uL (ref 4.0–10.5)

## 2014-07-01 LAB — URINALYSIS, ROUTINE W REFLEX MICROSCOPIC
BILIRUBIN URINE: NEGATIVE
GLUCOSE, UA: NEGATIVE mg/dL
HGB URINE DIPSTICK: NEGATIVE
KETONES UR: NEGATIVE mg/dL
Leukocytes, UA: NEGATIVE
NITRITE: NEGATIVE
PH: 6.5 (ref 5.0–8.0)
Protein, ur: NEGATIVE mg/dL
SPECIFIC GRAVITY, URINE: 1.018 (ref 1.005–1.030)
Urobilinogen, UA: 0.2 mg/dL (ref 0.0–1.0)

## 2014-07-01 MED ORDER — DICYCLOMINE HCL 10 MG PO CAPS
10.0000 mg | ORAL_CAPSULE | Freq: Once | ORAL | Status: AC
  Administered 2014-07-01: 10 mg via ORAL
  Filled 2014-07-01: qty 1

## 2014-07-01 MED ORDER — GI COCKTAIL ~~LOC~~
30.0000 mL | Freq: Once | ORAL | Status: AC
  Administered 2014-07-01: 30 mL via ORAL
  Filled 2014-07-01: qty 30

## 2014-07-01 MED ORDER — SIMETHICONE 80 MG PO CHEW
80.0000 mg | CHEWABLE_TABLET | Freq: Once | ORAL | Status: AC
  Administered 2014-07-01: 80 mg via ORAL
  Filled 2014-07-01: qty 1

## 2014-07-01 MED ORDER — LIDOCAINE VISCOUS 2 % MT SOLN
15.0000 mL | Freq: Once | OROMUCOSAL | Status: AC
  Administered 2014-07-01: 15 mL via OROMUCOSAL
  Filled 2014-07-01: qty 15

## 2014-07-01 MED ORDER — SUCRALFATE 1 G PO TABS
1.0000 g | ORAL_TABLET | Freq: Three times a day (TID) | ORAL | Status: DC
Start: 2014-07-01 — End: 2017-07-10

## 2014-07-01 MED ORDER — FAMOTIDINE 20 MG PO TABS: 20.0000 mg | ORAL_TABLET | Freq: Two times a day (BID) | ORAL | Status: DC

## 2014-07-01 NOTE — Discharge Instructions (Signed)
Take the prescribed medication as directed. °Follow-up with GI-- call to schedule appt. °Return to the ED for new or worsening symptoms. °

## 2014-07-01 NOTE — ED Notes (Signed)
Pt requesting to speak with PA regarding toothache.

## 2014-07-01 NOTE — ED Notes (Signed)
Pt comfortable with discharge and follow up instructions. Pt declines wheelchair, escorted to waiting area by this RN. Prescriptions x2. 

## 2014-07-01 NOTE — ED Provider Notes (Signed)
CSN: [id number]     Arrival date & time 07/01/14  [id number] History   First MD Initiated Contact with Patient 07/01/14 [phone]     Chief Complaint  Patient presents with  . Abdominal Pain     (Consider location/radiation/quality/duration/timing/severity/associated sxs/prior Treatment) Patient is a 39 y.o. male presenting with abdominal pain. The history is provided by the patient and medical records.  Abdominal Pain   This is a 39 year old male with past medical history significant for acid reflux, presenting to the ED for abdominal pain for the past month.  He states he was told he had GERD so has been trying to adjust his diet to help with symptoms, however has not had significant improvement.  He notes heartburn, gas, bloating, and cramping pains on a nearly daily basis.  He denies chest pain or SOB.  He states last week he did have N/V/D but thinks this was due to stomach bug as everyone else in his house was sick with similar symptoms.  He states he takes TUMS, rolaids, and pepto bismol at home.  On a usual day he may eat eggs, grits, sausage, [place], fish, occasional hamburger.  States he has tried to stay away from spicy/acidic foods.  He does admit to taking motrin recently due to toothache.  Patient has been referred to GI but never followed up because "it took too long to get an appt".    Past Medical History  Diagnosis Date  . Sickle cell trait   . Acid reflux    History reviewed. No pertinent past surgical history. No family history on file. History  Substance Use Topics  . Smoking status: Current Every Day Smoker -- 0.25 packs/day for 13 years    Types: Cigarettes  . Smokeless tobacco: Not on file  . Alcohol Use: 2.4 oz/week    4 Cans of beer per week    Review of Systems  Gastrointestinal: Positive for abdominal pain.  All other systems reviewed and are negative.     Allergies  Tramadol  Home Medications   Prior to Admission medications   Medication Sig Start Date  End Date Taking? Authorizing Provider  calcium carbonate (TUMS - DOSED IN MG ELEMENTAL CALCIUM) 500 MG chewable tablet Chew 1-2 tablets by mouth every 3 (three) hours as needed for indigestion or heartburn.   Yes Historical Provider, MD  Calcium Carbonate Antacid (ROLAIDS EXTRA STRENGTH PO) Take 1 tablet by mouth every 4 (four) hours as needed (indesgstion).   Yes Historical Provider, MD  bismuth subsalicylate (PEPTO BISMOL) 262 MG/15ML suspension Take 30 mLs by mouth every 6 (six) hours as needed for indigestion.    Historical Provider, MD  lansoprazole (PREVACID) 30 MG capsule Take 1 capsule (30 mg total) by mouth daily at 12 noon. Patient not taking: Reported on 07/01/2014 02/06/14   Jerelyn ScottMartha Linker, MD  oxyCODONE-acetaminophen (PERCOCET/ROXICET) 5-325 MG per tablet Take 1-2 tablets by mouth every 6 (six) hours as needed for severe pain. Patient not taking: Reported on 07/01/2014 02/06/14   Jerelyn ScottMartha Linker, MD  traMADol (ULTRAM) 50 MG tablet Take 1 tablet (50 mg total) by mouth every 6 (six) hours as needed. Patient not taking: Reported on 02/06/2014 11/08/13   Cathren LaineKevin Steinl, MD   BP 147/98 mmHg  Pulse 81  Temp(Src) 99.1 F (37.3 C) (Oral)  Resp 18  Ht 5\' 9"  (1.753 m)  Wt 180 lb (81.647 kg)  BMI 26.57 kg/m2  SpO2 100%   Physical Exam  Constitutional: He is oriented to person, place,  and time. He appears well-developed and well-nourished.  HENT:  Head: Normocephalic and atraumatic.  Right Ear: Tympanic membrane and ear canal normal.  Left Ear: Tympanic membrane and ear canal normal.  Nose: Nose normal.  Mouth/Throat: Uvula is midline, oropharynx is clear and moist and mucous membranes are normal. Normal dentition. No dental abscesses or dental caries. No oropharyngeal exudate, posterior oropharyngeal edema, posterior oropharyngeal erythema or tonsillar abscesses.  Teeth largely in fair dentition, no signs of dental infection or dental abscess; handling secretions appropriately, no trismus; no  facial or neck swelling  Eyes: Conjunctivae and EOM are normal. Pupils are equal, round, and reactive to light.  Neck: Normal range of motion.  Cardiovascular: Normal rate, regular rhythm and normal heart sounds.   Pulmonary/Chest: Effort normal and breath sounds normal.  Abdominal: Soft. Bowel sounds are normal. He exhibits no distension. There is no tenderness. There is no guarding.  Abdomen soft, nondistended, no focal tenderness or peritoneal signs  Musculoskeletal: Normal range of motion.  Neurological: He is alert and oriented to person, place, and time.  Skin: Skin is warm and dry.  Psychiatric: He has a normal mood and affect.  Nursing note and vitals reviewed.   ED Course  Procedures (including critical care time) Labs Review Labs Reviewed  COMPREHENSIVE METABOLIC PANEL - Abnormal; Notable for the following:    Glucose, Bld 108 (*)    All other components within normal limits  CBC WITH DIFFERENTIAL/PLATELET  URINALYSIS, ROUTINE W REFLEX MICROSCOPIC    Imaging Review No results found.   EKG Interpretation None      MDM   Final diagnoses:  Abdominal pain, unspecified abdominal location   39 year old male with abdominal pain for the past month. States he has changed his diet and reduce alcohol without significant relief. He has been taking Motrin over the past few days due to toothache. Patient afebrile and nontoxic in appearance. His abdominal exam is benign. No signs of dental abscess or underlying infection. No facial or neck swelling to suggest Ludwig's angina. Labwork is reassuring. Patient was given GI cocktail, gas-x, and bentyl with some improvement of his pain.  Doubt acute/surgical abdomen at this time.  Patient d/c home with supportive care, carafate, and pepcid.  Recommended FU with GI.  Discussed plan with patient, he/she acknowledged understanding and agreed with plan of care.  Return precautions given for new or worsening symptoms.  Garlon HatchetLisa M Clytie Shetley,  PA-C 07/01/14 1250  Benjiman CoreNathan Pickering, MD 07/01/14 1435

## 2014-07-01 NOTE — ED Notes (Signed)
Pt here for abd pain x a few months. Reports has changed his diet as directed and continues to have pain. Pt endorses vomiting and diarrhea last week.

## 2014-08-13 ENCOUNTER — Emergency Department (HOSPITAL_COMMUNITY)
Admission: EM | Admit: 2014-08-13 | Discharge: 2014-08-13 | Disposition: A | Discharge status: Home / Self Care | Payer: Self-pay | Attending: Emergency Medicine | Admitting: Emergency Medicine

## 2014-08-13 ENCOUNTER — Encounter (HOSPITAL_COMMUNITY): Payer: Self-pay | Admitting: *Deleted

## 2014-08-13 ENCOUNTER — Emergency Department (HOSPITAL_COMMUNITY): Payer: Self-pay

## 2014-08-13 DIAGNOSIS — K219 Gastro-esophageal reflux disease without esophagitis: Secondary | ICD-10-CM | POA: Insufficient documentation

## 2014-08-13 DIAGNOSIS — Z862 Personal history of diseases of the blood and blood-forming organs and certain disorders involving the immune mechanism: Secondary | ICD-10-CM | POA: Insufficient documentation

## 2014-08-13 DIAGNOSIS — M545 Low back pain: Secondary | ICD-10-CM | POA: Insufficient documentation

## 2014-08-13 DIAGNOSIS — Z79899 Other long term (current) drug therapy: Secondary | ICD-10-CM | POA: Insufficient documentation

## 2014-08-13 DIAGNOSIS — IMO0001 Reserved for inherently not codable concepts without codable children: Secondary | ICD-10-CM

## 2014-08-13 DIAGNOSIS — Z720 Tobacco use: Secondary | ICD-10-CM | POA: Insufficient documentation

## 2014-08-13 LAB — COMPREHENSIVE METABOLIC PANEL
ALT: 27 U/L (ref 17–63)
AST: 25 U/L (ref 15–41)
Albumin: 4.1 g/dL (ref 3.5–5.0)
Alkaline Phosphatase: 79 U/L (ref 38–126)
Anion gap: 9 (ref 5–15)
BILIRUBIN TOTAL: 0.3 mg/dL (ref 0.3–1.2)
BUN: 11 mg/dL (ref 6–20)
CO2: 23 mmol/L (ref 22–32)
Calcium: 9.1 mg/dL (ref 8.9–10.3)
Chloride: 105 mmol/L (ref 101–111)
Creatinine, Ser: 1.06 mg/dL (ref 0.61–1.24)
GFR calc non Af Amer: >60 mL/min (ref >60)
Glucose, Bld: 165 mg/dL — ABNORMAL HIGH (ref 65–99)
Potassium: 3.7 mmol/L (ref 3.5–5.1)
Sodium: 137 mmol/L (ref 135–145)
TOTAL PROTEIN: 6.8 g/dL (ref 6.5–8.1)

## 2014-08-13 LAB — CBC WITH DIFFERENTIAL/PLATELET
BASOS PCT: 0 % (ref 0–1)
Basophils Absolute: 0 10*3/uL (ref 0.0–0.1)
EOS PCT: 3 % (ref 0–5)
Eosinophils Absolute: 0.2 10*3/uL (ref 0.0–0.7)
HCT: 44.5 % (ref 39.0–52.0)
Hemoglobin: 15.6 g/dL (ref 13.0–17.0)
Lymphocytes Relative: 45 % (ref 12–46)
Lymphs Abs: 3.1 10*3/uL (ref 0.7–4.0)
MCH: 29 pg (ref 26.0–34.0)
MCHC: 35.1 g/dL (ref 30.0–36.0)
MCV: 82.7 fL (ref 78.0–100.0)
MONO ABS: 0.5 10*3/uL (ref 0.1–1.0)
MONOS PCT: 8 % (ref 3–12)
NEUTROS ABS: 3.1 10*3/uL (ref 1.7–7.7)
NEUTROS PCT: 45 % (ref 43–77)
PLATELETS: 238 10*3/uL (ref 150–400)
RBC: 5.38 MIL/uL (ref 4.22–5.81)
RDW: 13.4 % (ref 11.5–15.5)
WBC: 6.9 10*3/uL (ref 4.0–10.5)

## 2014-08-13 LAB — URINALYSIS, ROUTINE W REFLEX MICROSCOPIC
Bilirubin Urine: NEGATIVE
Glucose, UA: NEGATIVE mg/dL
Hgb urine dipstick: NEGATIVE
Ketones, ur: NEGATIVE mg/dL
Leukocytes, UA: NEGATIVE
Nitrite: NEGATIVE
PROTEIN: NEGATIVE mg/dL
SPECIFIC GRAVITY, URINE: 1.02 (ref 1.005–1.030)
Urobilinogen, UA: 1 mg/dL (ref 0.0–1.0)
pH: 6 (ref 5.0–8.0)

## 2014-08-13 LAB — LIPASE, BLOOD: Lipase: 33 U/L (ref 22–51)

## 2014-08-13 MED ORDER — HYOSCYAMINE SULFATE 0.125 MG SL SUBL: 0.1250 mg | SUBLINGUAL_TABLET | SUBLINGUAL | Status: DC | PRN

## 2014-08-13 MED ORDER — GI COCKTAIL ~~LOC~~
30.0000 mL | Freq: Once | ORAL | Status: AC
  Administered 2014-08-13: 30 mL via ORAL
  Filled 2014-08-13: qty 30

## 2014-08-13 MED ORDER — DICYCLOMINE HCL 10 MG/ML IM SOLN
20.0000 mg | Freq: Once | INTRAMUSCULAR | Status: AC
  Administered 2014-08-13: 20 mg via INTRAMUSCULAR
  Filled 2014-08-13: qty 2

## 2014-08-13 MED ORDER — CULTURELLE DIGESTIVE HEALTH PO CAPS: 1.0000 | ORAL_CAPSULE | Freq: Three times a day (TID) | ORAL | Status: DC

## 2014-08-13 MED ORDER — SIMETHICONE 40 MG/0.6ML PO SUSP (UNIT DOSE)
40.0000 mg | Freq: Once | ORAL | Status: AC
  Administered 2014-08-13: 40 mg via ORAL
  Filled 2014-08-13: qty 0.6

## 2014-08-13 NOTE — ED Notes (Signed)
Pt c/o abd pain for one month.  He vomited once one month ago.  Burping since last month also

## 2014-08-13 NOTE — ED Provider Notes (Signed)
CSN: [id number]     Arrival date & time 08/13/14  0223 History  This chart was scribed for Daisee Centner, MD by Evon Slackerrance Branch, ED Scribe. This patient was seen in room A11C/A11C and the patient's care was started at 2:47 AM.    Chief Complaint  Patient presents with  . Abdominal Pain   Patient is a 39 y.o. male presenting with abdominal pain. The history is provided by the patient. No language interpreter was used.  Abdominal Pain Pain location:  Generalized Pain quality: cramping   Pain radiates to:  Does not radiate Pain severity:  Mild Onset quality:  Gradual Timing:  Constant Chronicity:  Chronic (24 weeks) Context: not alcohol use  Diet changes: 24.   Relieved by:  Nothing Ineffective treatments:  OTC medications Associated symptoms: belching, flatus and nausea   Associated symptoms: no vomiting   Risk factors: no alcohol abuse    HPI Comments: Hector Wilcox is a [age] y.o. male with PMHx of acid reflux who presents to the Emergency Department complaining of abdominal pain onset 6 months prior. Pt states that he has associated belching, flatus, nausea and back pain. Pt states that he has recently tried to start eating more healthy with no relief. Pt states that the latest he eats at night is about 8 PM. Pt states that his acid reflux is worse when laying down. Pt states that he is prescribed several medications that don't provide any relief. Pt states that he has not been able to get in to see a GI specialist.   Past Medical History  Diagnosis Date  . Sickle cell trait   . Acid reflux    History reviewed. No pertinent past surgical history. No family history on file. History  Substance Use Topics  . Smoking status: Current Every Day Smoker -- 0.25 packs/day for 13 years    Types: Cigarettes  . Smokeless tobacco: Not on file  . Alcohol Use: 2.4 oz/week    4 Cans of beer per week    Review of Systems  Gastrointestinal: Positive for nausea, abdominal pain and flatus.  Negative for vomiting.  Musculoskeletal: Positive for back pain.  All other systems reviewed and are negative.     Allergies  Tramadol  Home Medications   Prior to Admission medications   Medication Sig Start Date End Date Taking? Authorizing Provider  famotidine (PEPCID) 20 MG tablet Take 1 tablet (20 mg total) by mouth 2 (two) times daily. 07/01/14  Yes Garlon HatchetLisa M Sanders, PA-C  lansoprazole (PREVACID) 30 MG capsule Take 1 capsule (30 mg total) by mouth daily at 12 noon. 02/06/14  Yes Jerelyn ScottMartha Linker, MD  sucralfate (CARAFATE) 1 G tablet Take 1 tablet (1 g total) by mouth 4 (four) times daily -  with meals and at bedtime. 07/01/14  Yes Garlon HatchetLisa M Sanders, PA-C  oxyCODONE-acetaminophen (PERCOCET/ROXICET) 5-325 MG per tablet Take 1-2 tablets by mouth every 6 (six) hours as needed for severe pain. Patient not taking: Reported on 07/01/2014 02/06/14   Jerelyn ScottMartha Linker, MD  traMADol (ULTRAM) 50 MG tablet Take 1 tablet (50 mg total) by mouth every 6 (six) hours as needed. Patient not taking: Reported on 02/06/2014 11/08/13   Cathren LaineKevin Steinl, MD   BP 142/74 mmHg  Pulse 86  Temp(Src) 98 F (36.7 C) (Oral)  Resp 18  SpO2 97%   Physical Exam  Constitutional: He is oriented to person, place, and time. He appears well-developed and well-nourished. No distress.  HENT:  Head: Normocephalic and atraumatic.  Mouth/Throat: Oropharynx is clear and moist. No oropharyngeal exudate.  Eyes: Conjunctivae and EOM are normal. Pupils are equal, round, and reactive to light.  Neck: Normal range of motion. Neck supple. No tracheal deviation present.  Cardiovascular: Normal rate.   Pulmonary/Chest: Effort normal and breath sounds normal. No respiratory distress. He has no wheezes. He has no rales.  Abdominal: Soft. Bowel sounds are increased. There is no tenderness. There is no rigidity, no rebound, no guarding, no tenderness at McBurney's point and negative Murphy's sign.  Extremely loud hyperactive BS  Musculoskeletal:  Normal range of motion. He exhibits no edema or tenderness.  Neurological: He is alert and oriented to person, place, and time. He has normal reflexes.  Skin: Skin is warm and dry.  Psychiatric: He has a normal mood and affect. His behavior is normal.  Nursing note and vitals reviewed.   ED Course  Procedures (including critical care time) DIAGNOSTIC STUDIES: Oxygen Saturation is 97% on RA, normal by my interpretation.    COORDINATION OF CARE: 3:02 AM-Discussed treatment plan with pt at bedside and pt agreed to plan.     Labs Review Labs Reviewed  CBC WITH DIFFERENTIAL/PLATELET  COMPREHENSIVE METABOLIC PANEL  LIPASE, BLOOD  URINALYSIS, ROUTINE W REFLEX MICROSCOPIC (NOT AT [facility])    Imaging Review No results found.   EKG Interpretation None      MDM   Final diagnoses:  None    Will add levsin and give GI follow up and bland diet.  Will also add probiotic.    I personally performed the services described in this documentation, which was scribed in my presence. The recorded information has been reviewed and is accurate.       Cy Blamer, MD 08/13/14 [phone]

## 2014-08-13 NOTE — ED Notes (Signed)
Pt reports RLQ abdominal pain ongoing for a month. States tonight he had a bowel movement, began belching and [facility] States his abd pain has moved over to the left side and R flank. States he also is experiencing chest pain.

## 2014-08-13 NOTE — Discharge Instructions (Signed)
Bloating Bloating is the feeling of fullness in your belly. You may feel as though your pants are too tight. Often the cause of bloating is overeating, retaining fluids, or having gas in your bowel. It is also caused by swallowing air and eating foods that cause gas. Irritable bowel syndrome is one of the most common causes of bloating. Constipation is also a common cause. Sometimes more serious problems can cause bloating. SYMPTOMS  Usually there is a feeling of fullness, as though your abdomen is bulged out. There may be mild discomfort.  DIAGNOSIS  Usually no particular testing is necessary for most bloating. If the condition persists and seems to become worse, your caregiver may do additional testing.  TREATMENT   There is no direct treatment for bloating.  Do not put gas into the bowel. Avoid chewing gum and sucking on candy. These tend to make you swallow air. Swallowing air can also be a nervous habit. Try to avoid this.  Avoiding high residue diets will help. Eat foods with soluble fibers (examples include root vegetables, apples, or barley) and substitute dairy products with soy and rice products. This helps irritable bowel syndrome.  If constipation is the cause, then a high residue diet with more fiber will help.  Avoid carbonated beverages.  Over-the-counter preparations are available that help reduce gas. Your pharmacist can help you with this. SEEK MEDICAL CARE IF:   Bloating continues and seems to be getting worse.  You notice a weight gain.  You have a weight loss but the bloating is getting worse.  You have changes in your bowel habits or develop nausea or vomiting. SEEK IMMEDIATE MEDICAL CARE IF:   You develop shortness of breath or swelling in your legs.  You have an increase in abdominal pain or develop chest pain. Document Released: 11/28/2005 Document Revised: 04/22/2011 Document Reviewed: 01/16/2007 ExitCare Patient Information 2015 [employer]. This  information is not intended to replace advice given to you by your health care provider. Make sure you discuss any questions you have with your health care provider.  

## 2014-08-13 NOTE — ED Notes (Signed)
Too busy talking on the phone to be triaged

## 2015-01-13 ENCOUNTER — Emergency Department (HOSPITAL_COMMUNITY)
Admission: EM | Admit: 2015-01-13 | Discharge: 2015-01-13 | Disposition: A | Discharge status: Home / Self Care | Payer: Self-pay | Attending: Emergency Medicine | Admitting: Emergency Medicine

## 2015-01-13 ENCOUNTER — Encounter (HOSPITAL_COMMUNITY): Payer: Self-pay | Admitting: Family Medicine

## 2015-01-13 DIAGNOSIS — Z862 Personal history of diseases of the blood and blood-forming organs and certain disorders involving the immune mechanism: Secondary | ICD-10-CM | POA: Insufficient documentation

## 2015-01-13 DIAGNOSIS — F17210 Nicotine dependence, cigarettes, uncomplicated: Secondary | ICD-10-CM | POA: Insufficient documentation

## 2015-01-13 DIAGNOSIS — Z79899 Other long term (current) drug therapy: Secondary | ICD-10-CM | POA: Insufficient documentation

## 2015-01-13 DIAGNOSIS — K219 Gastro-esophageal reflux disease without esophagitis: Secondary | ICD-10-CM | POA: Insufficient documentation

## 2015-01-13 LAB — COMPREHENSIVE METABOLIC PANEL
ALBUMIN: 4.1 g/dL (ref 3.5–5.0)
ALT: 37 U/L (ref 17–63)
ANION GAP: 10 (ref 5–15)
AST: 31 U/L (ref 15–41)
Alkaline Phosphatase: 82 U/L (ref 38–126)
BILIRUBIN TOTAL: 0.5 mg/dL (ref 0.3–1.2)
BUN: 8 mg/dL (ref 6–20)
CO2: 27 mmol/L (ref 22–32)
Calcium: 9.5 mg/dL (ref 8.9–10.3)
Chloride: 103 mmol/L (ref 101–111)
Creatinine, Ser: 0.94 mg/dL (ref 0.61–1.24)
GFR calc Af Amer: >60 mL/min (ref >60)
GLUCOSE: 99 mg/dL (ref 65–99)
POTASSIUM: 4.2 mmol/L (ref 3.5–5.1)
Sodium: 140 mmol/L (ref 135–145)
Total Protein: 7.1 g/dL (ref 6.5–8.1)

## 2015-01-13 LAB — URINALYSIS, ROUTINE W REFLEX MICROSCOPIC
Bilirubin Urine: NEGATIVE
Glucose, UA: NEGATIVE mg/dL
HGB URINE DIPSTICK: NEGATIVE
KETONES UR: NEGATIVE mg/dL
LEUKOCYTES UA: NEGATIVE
Nitrite: NEGATIVE
Protein, ur: NEGATIVE mg/dL
Specific Gravity, Urine: 1.017 (ref 1.005–1.030)
pH: 8 (ref 5.0–8.0)

## 2015-01-13 LAB — CBC
HEMATOCRIT: 45.3 % (ref 39.0–52.0)
HEMOGLOBIN: 15.8 g/dL (ref 13.0–17.0)
MCH: 29.9 pg (ref 26.0–34.0)
MCHC: 34.9 g/dL (ref 30.0–36.0)
MCV: 85.8 fL (ref 78.0–100.0)
Platelets: 213 10*3/uL (ref 150–400)
RBC: 5.28 MIL/uL (ref 4.22–5.81)
RDW: 13.5 % (ref 11.5–15.5)
WBC: 5.1 10*3/uL (ref 4.0–10.5)

## 2015-01-13 LAB — LIPASE, BLOOD: LIPASE: 32 U/L (ref 11–51)

## 2015-01-13 MED ORDER — BISMUTH SUBSALICYLATE 262 MG/15ML PO SUSP: 30.0000 mL | Freq: Three times a day (TID) | ORAL | Status: AC

## 2015-01-13 MED ORDER — RANITIDINE HCL 150 MG PO TABS: 150.0000 mg | ORAL_TABLET | Freq: Two times a day (BID) | ORAL | Status: DC

## 2015-01-13 MED ORDER — METRONIDAZOLE 500 MG PO TABS: 500.0000 mg | ORAL_TABLET | Freq: Four times a day (QID) | ORAL | Status: DC

## 2015-01-13 MED ORDER — TETRACYCLINE HCL 500 MG PO CAPS: 500.0000 mg | ORAL_CAPSULE | Freq: Four times a day (QID) | ORAL | Status: DC

## 2015-01-13 NOTE — ED Provider Notes (Signed)
CSN: [id number]     Arrival date & time 01/13/15  0715 History   First MD Initiated Contact with Patient 01/13/15 0730     Chief Complaint  Patient presents with  . Abdominal Pain     (Consider location/radiation/quality/duration/timing/severity/associated sxs/prior Treatment) Patient is a 39 y.o. male presenting with abdominal pain.  Abdominal Pain Pain location:  Epigastric and R flank Pain quality: aching and bloating   Pain radiates to:  Does not radiate Pain severity:  Mild Onset quality:  Gradual Duration:  2 weeks Timing:  Intermittent Progression:  Worsening Chronicity:  New Context: not alcohol use   Relieved by:  Bowel activity Worsened by:  Nothing tried Ineffective treatments:  None tried Associated symptoms: belching and diarrhea   Associated symptoms: no cough, no fever, no nausea and no vomiting     Past Medical History  Diagnosis Date  . Sickle cell trait (HCC)   . Acid reflux    History reviewed. No pertinent past surgical history. History reviewed. No pertinent family history. Social History  Substance Use Topics  . Smoking status: Current Every Day Smoker -- 0.25 packs/day for 13 years    Types: Cigarettes  . Smokeless tobacco: None  . Alcohol Use: 2.4 oz/week    4 Cans of beer per week    Review of Systems  Constitutional: Negative for fever.  Respiratory: Negative for cough.   Gastrointestinal: Positive for abdominal pain and diarrhea. Negative for nausea and vomiting.  All other systems reviewed and are negative.     Allergies  Tramadol  Home Medications   Prior to Admission medications   Medication Sig Start Date End Date Taking? Authorizing Provider  famotidine (PEPCID) 20 MG tablet Take 1 tablet (20 mg total) by mouth 2 (two) times daily. 07/01/14   Garlon Hatchet, PA-C  hyoscyamine (LEVSIN/SL) 0.125 MG SL tablet Place 1 tablet (0.125 mg total) under the tongue every 4 (four) hours as needed. 08/13/14   April Palumbo, MD   Lactobacillus-Inulin (CULTURELLE DIGESTIVE HEALTH) CAPS Take 1 capsule by mouth 3 (three) times daily. 08/13/14   April Palumbo, MD  lansoprazole (PREVACID) 30 MG capsule Take 1 capsule (30 mg total) by mouth daily at 12 noon. 02/06/14   Jerelyn Scott, MD  oxyCODONE-acetaminophen (PERCOCET/ROXICET) 5-325 MG per tablet Take 1-2 tablets by mouth every 6 (six) hours as needed for severe pain. Patient not taking: Reported on 07/01/2014 02/06/14   Jerelyn Scott, MD  sucralfate (CARAFATE) 1 G tablet Take 1 tablet (1 g total) by mouth 4 (four) times daily -  with meals and at bedtime. 07/01/14   Garlon Hatchet, PA-C  traMADol (ULTRAM) 50 MG tablet Take 1 tablet (50 mg total) by mouth every 6 (six) hours as needed. Patient not taking: Reported on 02/06/2014 11/08/13   Cathren Laine, MD   BP 125/105 mmHg  Pulse 72  Temp(Src) 98.1 F (36.7 C) (Oral)  Resp 17  SpO2 98% Physical Exam  Constitutional: He is oriented to person, place, and time. He appears well-developed and well-nourished. No distress.  HENT:  Head: Normocephalic and atraumatic.  Eyes: Conjunctivae are normal.  Neck: Neck supple. No tracheal deviation present.  Cardiovascular: Normal rate, regular rhythm and normal heart sounds.   Pulmonary/Chest: Effort normal and breath sounds normal. No respiratory distress. He has no wheezes.  Abdominal: Soft. He exhibits no distension. There is no tenderness. There is no rigidity, no rebound, no guarding, no tenderness at McBurney's point and negative Murphy's sign.  Neurological: He  is alert and oriented to person, place, and time.  Skin: Skin is warm and dry.  Psychiatric: He has a normal mood and affect.    ED Course  Procedures (including critical care time) Labs Review Labs Reviewed  LIPASE, BLOOD  COMPREHENSIVE METABOLIC PANEL  CBC  URINALYSIS, ROUTINE W REFLEX MICROSCOPIC (NOT [facility])  H. PYLORI ANTIBODY, IGG    Imaging Review No results found. I have personally reviewed and  evaluated these images and lab results as part of my medical decision-making.   EKG Interpretation   Date/Time:  Friday January 13 2015 07:31:24 EST Ventricular Rate:  61 PR Interval:  156 QRS Duration: 87 QT Interval:  382 QTC Calculation: 385 R Axis:   30 Text Interpretation:  Sinus arrhythmia Benign early repolarization Normal  ECG No significant change since last tracing Confirmed by Simrah Chatham MD, Rage Beever  ([id number]) on 01/13/2015 7:51:04 AM      MDM   Final diagnoses:  Gastroesophageal reflux disease, esophagitis presence not specified    39 year old male presents with persistent ongoing difficulty with intermittent diarrhea and abdominal pain diffusely. He has been previously treated with Carafate, PPI, H2 blockers, and has had no relief of his upper abdominal pain and reflux. His symptoms are potentially consistent with irritable bowel syndrome and I recommended that he start on a regimen of fiber supplementation to help with stool bulking and regularity. His abdominal exam is benign and his laboratory analysis is unremarkable today. Due to his refractory reflux symptoms and H. pylori antigen test was ordered to evaluate for potential etiology. Test is a send out lab so I recommended the patient call back until later date to inquire about results and if positive he may start triple therapy which was provided in prescription form to treat this. He has been noncompliant with multiple gastroenterology scheduled visits due to forgetfulness and length of time to appointment. I recommended that he follow-up with a primary care physician to coordinate scheduling and clearly marked out his calendar and ask off from work for an advanced to try to make these appointments. I explained to the emergency department and likely did not have much to offer him in further treatment beyond what was discussed at this visit. He was instructed to come back at any time with worsening abdominal pain, fevers, bloody  bowel movements, or other concerning symptoms that may require emergent management.    Lyndal Pulleyaniel Chantae Soo, MD 01/13/15 [phone]

## 2015-01-13 NOTE — ED Notes (Signed)
Pt here for continued abd pain and diarrhea. sts seen multiple times for the same. sts he has been taking medication without relief.

## 2015-01-13 NOTE — Discharge Instructions (Signed)
Food Choices for Gastroesophageal Reflux Disease, Adult When you have gastroesophageal reflux disease (GERD), the foods you eat and your eating habits are very important. Choosing the right foods can help ease the discomfort of GERD. WHAT GENERAL GUIDELINES DO I NEED TO FOLLOW?  Choose fruits, vegetables, whole grains, low-fat dairy products, and low-fat meat, fish, and poultry.  Limit fats such as oils, salad dressings, butter, nuts, and avocado.  Keep a food diary to identify foods that cause symptoms.  Avoid foods that cause reflux. These may be different for different people.  Eat frequent small meals instead of three large meals each day.  Eat your meals slowly, in a relaxed setting.  Limit fried foods.  Cook foods using methods other than frying.  Avoid drinking alcohol.  Avoid drinking large amounts of liquids with your meals.  Avoid bending over or lying down until 2-3 hours after eating. WHAT FOODS ARE NOT RECOMMENDED? The following are some foods and drinks that may worsen your symptoms: Vegetables Tomatoes. Tomato juice. Tomato and spaghetti sauce. Chili peppers. Onion and garlic. Horseradish. Fruits Oranges, grapefruit, and lemon (fruit and juice). Meats High-fat meats, fish, and poultry. This includes hot dogs, ribs, ham, sausage, salami, and bacon. Dairy Whole milk and chocolate milk. Sour cream. Cream. Butter. Ice cream. Cream cheese.  Beverages Coffee and tea, with or without caffeine. Carbonated beverages or energy drinks. Condiments Hot sauce. Barbecue sauce.  Sweets/Desserts Chocolate and cocoa. Donuts. Peppermint and spearmint. Fats and Oils High-fat foods, including French fries and potato chips. Other Vinegar. Strong spices, such as black pepper, white pepper, red pepper, cayenne, curry powder, cloves, ginger, and chili powder. The items listed above may not be a complete list of foods and beverages to avoid. Contact your dietitian for more  information.   This information is not intended to replace advice given to you by your health care provider. Make sure you discuss any questions you have with your health care provider.   Document Released: 01/28/2005 Document Revised: 02/18/2014 Document Reviewed: 12/02/2012 Elsevier Interactive Patient Education 2016 [employer]  

## 2015-01-16 LAB — H. PYLORI ANTIBODY, IGG: H Pylori IgG: 1.1 U/mL — ABNORMAL HIGH (ref 0.0–0.8)

## 2016-09-27 ENCOUNTER — Encounter (HOSPITAL_COMMUNITY): Payer: Self-pay | Admitting: Emergency Medicine

## 2016-09-27 ENCOUNTER — Ambulatory Visit (HOSPITAL_COMMUNITY)
Admission: EM | Admit: 2016-09-27 | Discharge: 2016-09-27 | Disposition: A | Discharge status: Home / Self Care | Payer: Self-pay | Attending: Family Medicine | Admitting: Family Medicine

## 2016-09-27 DIAGNOSIS — J40 Bronchitis, not specified as acute or chronic: Secondary | ICD-10-CM | POA: Insufficient documentation

## 2016-09-27 DIAGNOSIS — S20469A Insect bite (nonvenomous) of unspecified back wall of thorax, initial encounter: Secondary | ICD-10-CM

## 2016-09-27 DIAGNOSIS — T148XXA Other injury of unspecified body region, initial encounter: Secondary | ICD-10-CM | POA: Insufficient documentation

## 2016-09-27 DIAGNOSIS — Z202 Contact with and (suspected) exposure to infections with a predominantly sexual mode of transmission: Secondary | ICD-10-CM

## 2016-09-27 DIAGNOSIS — R05 Cough: Secondary | ICD-10-CM

## 2016-09-27 DIAGNOSIS — K219 Gastro-esophageal reflux disease without esophagitis: Secondary | ICD-10-CM | POA: Insufficient documentation

## 2016-09-27 DIAGNOSIS — D573 Sickle-cell trait: Secondary | ICD-10-CM | POA: Insufficient documentation

## 2016-09-27 DIAGNOSIS — F17210 Nicotine dependence, cigarettes, uncomplicated: Secondary | ICD-10-CM | POA: Insufficient documentation

## 2016-09-27 MED ORDER — AZITHROMYCIN 250 MG PO TABS: 250.0000 mg | ORAL_TABLET | Freq: Every day | ORAL | 0 refills | Status: DC

## 2016-09-27 MED ORDER — PREDNISONE 10 MG (21) PO TBPK: ORAL_TABLET | Freq: Every day | ORAL | 0 refills | Status: DC

## 2016-09-27 MED ORDER — KETOROLAC TROMETHAMINE 30 MG/ML IJ SOLN: 30.0000 mg | Freq: Once | INTRAMUSCULAR | Status: DC

## 2016-09-27 MED ORDER — FLUTICASONE PROPIONATE 50 MCG/ACT NA SUSP: 2.0000 | Freq: Every day | NASAL | 0 refills | Status: DC

## 2016-09-27 NOTE — ED Triage Notes (Signed)
PT reports productive cough for 2 months with fatigue.  PT reports he was bitten/stung by an insect today on his back and immediately became sweaty and lightheaded. EMS came and gave him benadryl. PT did not go to hospital. PT reports no airway involvement now or at the time of the incident.  PT also reports he is having more frequent BMs

## 2016-09-27 NOTE — ED Provider Notes (Signed)
[facility]    CSN: [id number] Arrival date & time: 09/27/16  1854     History   Chief Complaint Chief Complaint  Patient presents with  . Cough  . Insect Bite    HPI Hector Wilcox is a 41 y.o. male.   41 year old male comes in for 2 month history of cough. Patient states cough is productive, with some rhinorrhea. Patient denies fever, chills, night sweats. Denies sore throat, ear pain, eye pain. Has taken some musinex without relief. Denies chest pain, shortness of breath, wheezing. Patient is a daily smoker, has not needed to decrease smoking amount due to cough. He also would like STD checking as he states he is a nervous person and want to make sure he is ok, since he has been having some loose stools. Denies abdominal pain, nausea, vomiting.       Past Medical History:  Diagnosis Date  . Acid reflux   . Sickle cell trait (HCC)     There are no active problems to display for this patient.   History reviewed. No pertinent surgical history.     Home Medications    Prior to Admission medications   Medication Sig Start Date End Date Taking? Authorizing Provider  azithromycin (ZITHROMAX) 250 MG tablet Take 1 tablet (250 mg total) by mouth daily. Take first 2 tablets together, then 1 every day until finished. 09/27/16   Cathie Hoops, [place] V, PA-C  bismuth subsalicylate (PEPTO-BISMOL) 262 MG/15ML suspension Take 30 mLs by mouth 4 (four) times daily -  before meals and at bedtime. 01/13/15   Lyndal Pulley, MD  esomeprazole (NEXIUM) 20 MG capsule Take 20 mg by mouth daily at 12 noon.    [provider]  famotidine (PEPCID) 20 MG tablet Take 1 tablet (20 mg total) by mouth 2 (two) times daily. Patient not taking: Reported on 01/13/2015 07/01/14   Garlon Hatchet, PA-C  fluticasone [facility]) 50 MCG/ACT nasal spray Place 2 sprays into both nostrils daily. 09/27/16   Cathie Hoops, Shandelle Borrelli V, PA-C  hyoscyamine (LEVSIN/SL) 0.125 MG SL tablet Place 1 tablet (0.125 mg total) under the  tongue every 4 (four) hours as needed. Patient not taking: Reported on 01/13/2015 08/13/14   Palumbo, April, MD  Lactobacillus-Inulin (CULTURELLE DIGESTIVE HEALTH) CAPS Take 1 capsule by mouth 3 (three) times daily. Patient not taking: Reported on 01/13/2015 08/13/14   Palumbo, April, MD  lansoprazole (PREVACID) 30 MG capsule Take 1 capsule (30 mg total) by mouth daily at 12 noon. Patient not taking: Reported on 01/13/2015 02/06/14   Phillis Haggis, MD  metroNIDAZOLE (FLAGYL) 500 MG tablet Take 1 tablet (500 mg total) by mouth 4 (four) times daily. 01/13/15   Lyndal Pulley, MD  oxyCODONE-acetaminophen (PERCOCET/ROXICET) 5-325 MG per tablet Take 1-2 tablets by mouth every 6 (six) hours as needed for severe pain. Patient not taking: Reported on 07/01/2014 02/06/14   Phillis Haggis, MD  predniSONE (STERAPRED UNI-PAK 21 TAB) 10 MG (21) TBPK tablet Take by mouth daily. Take 6 tabs by mouth day 1, then 5 tabs, then 4 tabs, then 3 tabs, 2 tabs, then 1 tab for the last day 09/27/16   Belinda Fisher, PA-C  ranitidine (ZANTAC) 150 MG tablet Take 1 tablet (150 mg total) by mouth 2 (two) times daily. 01/13/15   Lyndal Pulley, MD  sucralfate (CARAFATE) 1 G tablet Take 1 tablet (1 g total) by mouth 4 (four) times daily -  with meals and at bedtime. Patient taking  differently: Take 1 g by mouth 2 (two) times daily.  07/01/14   Garlon Hatchet, PA-C  tetracycline (ACHROMYCIN,SUMYCIN) 500 MG capsule Take 1 capsule (500 mg total) by mouth 4 (four) times daily. 01/13/15   Lyndal Pulley, MD  traMADol (ULTRAM) 50 MG tablet Take 1 tablet (50 mg total) by mouth every 6 (six) hours as needed. Patient not taking: Reported on 02/06/2014 11/08/13   Cathren Laine, MD    Family History No family history on file.  Social History Social History  Substance Use Topics  . Smoking status: Current Every Day Smoker    Packs/day: 0.25    Years: 13.00    Types: Cigars  . Smokeless tobacco: Never Used  . Alcohol use 2.4 oz/week    4 Cans of  beer per week     Allergies   Tramadol   Review of Systems Review of Systems  Reason unable to perform ROS: See HPI as above.     Physical Exam Triage Vital Signs ED Triage Vitals [09/27/16 1934]  Enc Vitals Group     BP (!) 148/88     Pulse Rate 91     Resp 16     Temp 98.5 F (36.9 C)     Temp Source Oral     SpO2 99 %     Weight 180 lb (81.6 kg)     Height 5\' 9"  (1.753 m)     Head Circumference      Peak Flow      Pain Score 0     Pain Loc      Pain Edu?      Excl. in GC?    No data found.   Updated Vital Signs BP (!) 148/88   Pulse 91   Temp 98.5 F (36.9 C) (Oral)   Resp 16   Ht 5\' 9"  (1.753 m)   Wt 180 lb (81.6 kg)   SpO2 99%   BMI 26.58 kg/m   Visual Acuity Right Eye Distance:   Left Eye Distance:   Bilateral Distance:    Right Eye Near:   Left Eye Near:    Bilateral Near:     Physical Exam  Constitutional: He is oriented to person, place, and time. He appears well-developed and well-nourished. No distress.  HENT:  Head: Normocephalic and atraumatic.  Right Ear: External ear and ear canal normal. Tympanic membrane is not erythematous and not bulging. A middle ear effusion is present.  Left Ear: External ear and ear canal normal. Tympanic membrane is erythematous. Tympanic membrane is not bulging. A middle ear effusion is present.  Nose: Nose normal. Right sinus exhibits no maxillary sinus tenderness and no frontal sinus tenderness. Left sinus exhibits no maxillary sinus tenderness and no frontal sinus tenderness.  Mouth/Throat: Uvula is midline, oropharynx is clear and moist and mucous membranes are normal.  Eyes: Pupils are equal, round, and reactive to light. Conjunctivae are normal.  Neck: Normal range of motion. Neck supple.  Cardiovascular: Normal rate, regular rhythm and normal heart sounds.  Exam reveals no gallop and no friction rub.   No murmur heard. Pulmonary/Chest: Effort normal and breath sounds normal. He has no decreased  breath sounds. He has no wheezes. He has no rhonchi. He has no rales.  Lymphadenopathy:    He has no cervical adenopathy.  Neurological: He is alert and oriented to person, place, and time.  Skin: Skin is warm and dry.  Psychiatric: He has a normal mood and affect. His  behavior is normal. Judgment normal.     UC Treatments / Results  Labs (all labs ordered are listed, but only abnormal results are displayed) Labs Reviewed  URINE CYTOLOGY ANCILLARY ONLY    EKG  EKG Interpretation None       Radiology No results found.  Procedures Procedures (including critical care time)  Medications Ordered in UC Medications - No data to display   Initial Impression / Assessment and Plan / UC Course  I have reviewed the triage vital signs and the nursing notes.  Pertinent labs & imaging results that were available during my care of the patient were reviewed by me and considered in my medical decision making (see chart for details).    Given smoking history with 2 month history of cough, will treat as if COPD exacerbation. Azithromycin and prednisone as directed. Urine cytology sent, patient will be contacted with any positive results, additional treatment needed will be called in then. Return precautions given.   Final Clinical Impressions(s) / UC Diagnoses   Final diagnoses:  Bronchitis  Possible exposure to STD    New Prescriptions Discharge Medication List as of 09/27/2016  8:18 PM    START taking these medications   Details  azithromycin (ZITHROMAX) 250 MG tablet Take 1 tablet (250 mg total) by mouth daily. Take first 2 tablets together, then 1 every day until finished., Starting Fri 09/27/2016, Normal    fluticasone (FLONASE) 50 MCG/ACT nasal spray Place 2 sprays into both nostrils daily., Starting Fri 09/27/2016, Normal          Belinda Fisher, PA-C 09/27/16 2119

## 2016-09-27 NOTE — Discharge Instructions (Signed)
Take azithromycin, prednisone as directed. Flonase for nasal congestion. The urine is sent for STD testing, including gonorrhea, chlamydia, Trichomonas. You will be contacted with any positive results, treatment needed will be called in. If experiencing worsening of symptoms, trouble breathing, wheezing, chest pain, follow up at the emergency department for further evaluation.

## 2016-09-30 LAB — URINE CYTOLOGY ANCILLARY ONLY
CHLAMYDIA, DNA PROBE: NEGATIVE
NEISSERIA GONORRHEA: NEGATIVE
Trichomonas: NEGATIVE

## 2017-07-10 ENCOUNTER — Emergency Department (HOSPITAL_COMMUNITY)
Admission: EM | Admit: 2017-07-10 | Discharge: 2017-07-10 | Disposition: A | Discharge status: Home / Self Care | Payer: Self-pay | Attending: Emergency Medicine | Admitting: Emergency Medicine

## 2017-07-10 ENCOUNTER — Encounter (HOSPITAL_COMMUNITY): Payer: Self-pay | Admitting: Emergency Medicine

## 2017-07-10 ENCOUNTER — Other Ambulatory Visit: Payer: Self-pay

## 2017-07-10 ENCOUNTER — Emergency Department (HOSPITAL_COMMUNITY): Payer: Self-pay

## 2017-07-10 DIAGNOSIS — R55 Syncope and collapse: Secondary | ICD-10-CM | POA: Insufficient documentation

## 2017-07-10 DIAGNOSIS — F17290 Nicotine dependence, other tobacco product, uncomplicated: Secondary | ICD-10-CM | POA: Insufficient documentation

## 2017-07-10 DIAGNOSIS — R109 Unspecified abdominal pain: Secondary | ICD-10-CM

## 2017-07-10 DIAGNOSIS — Z79899 Other long term (current) drug therapy: Secondary | ICD-10-CM | POA: Insufficient documentation

## 2017-07-10 DIAGNOSIS — N281 Cyst of kidney, acquired: Secondary | ICD-10-CM | POA: Insufficient documentation

## 2017-07-10 DIAGNOSIS — R1084 Generalized abdominal pain: Secondary | ICD-10-CM | POA: Insufficient documentation

## 2017-07-10 DIAGNOSIS — D573 Sickle-cell trait: Secondary | ICD-10-CM | POA: Insufficient documentation

## 2017-07-10 LAB — URINALYSIS, ROUTINE W REFLEX MICROSCOPIC
BILIRUBIN URINE: NEGATIVE
Bacteria, UA: NONE SEEN
Glucose, UA: NEGATIVE mg/dL
KETONES UR: NEGATIVE mg/dL
LEUKOCYTES UA: NEGATIVE
Nitrite: NEGATIVE
PH: 6 (ref 5.0–8.0)
Protein, ur: NEGATIVE mg/dL
Specific Gravity, Urine: 1.018 (ref 1.005–1.030)

## 2017-07-10 LAB — BASIC METABOLIC PANEL
ANION GAP: 6 (ref 5–15)
BUN: 9 mg/dL (ref 6–20)
CALCIUM: 9.1 mg/dL (ref 8.9–10.3)
CHLORIDE: 106 mmol/L (ref 101–111)
CO2: 27 mmol/L (ref 22–32)
CREATININE: 0.92 mg/dL (ref 0.61–1.24)
GFR calc non Af Amer: >60 mL/min (ref >60)
Glucose, Bld: 98 mg/dL (ref 65–99)
Potassium: 4 mmol/L (ref 3.5–5.1)
Sodium: 139 mmol/L (ref 135–145)

## 2017-07-10 LAB — HEPATIC FUNCTION PANEL
ALT: 27 U/L (ref 17–63)
AST: 36 U/L (ref 15–41)
Albumin: 4.1 g/dL (ref 3.5–5.0)
Alkaline Phosphatase: 77 U/L (ref 38–126)
BILIRUBIN TOTAL: 0.6 mg/dL (ref 0.3–1.2)
Total Protein: 6.9 g/dL (ref 6.5–8.1)

## 2017-07-10 LAB — CBC
HCT: 46.3 % (ref 39.0–52.0)
HEMOGLOBIN: 16.1 g/dL (ref 13.0–17.0)
MCH: 29.7 pg (ref 26.0–34.0)
MCHC: 34.8 g/dL (ref 30.0–36.0)
MCV: 85.4 fL (ref 78.0–100.0)
PLATELETS: 224 10*3/uL (ref 150–400)
RBC: 5.42 MIL/uL (ref 4.22–5.81)
RDW: 13.2 % (ref 11.5–15.5)
WBC: 6.1 10*3/uL (ref 4.0–10.5)

## 2017-07-10 LAB — LIPASE, BLOOD: LIPASE: 36 U/L (ref 11–51)

## 2017-07-10 MED ORDER — FAMOTIDINE 20 MG PO TABS: 20.0000 mg | ORAL_TABLET | Freq: Two times a day (BID) | ORAL | 0 refills | Status: DC

## 2017-07-10 MED ORDER — PANTOPRAZOLE SODIUM 40 MG PO TBEC: 40.0000 mg | DELAYED_RELEASE_TABLET | Freq: Every day | ORAL | 0 refills | Status: DC

## 2017-07-10 MED ORDER — SUCRALFATE 1 G PO TABS: 1.0000 g | ORAL_TABLET | Freq: Three times a day (TID) | ORAL | 0 refills | Status: DC

## 2017-07-10 NOTE — ED Triage Notes (Signed)
Pt reports was at work this morning began feeling weak, lightheaded, had to sit down due to weakness. Pt c/o RLQ pain and belching, diarrhea. Denies recent fever. VSS.

## 2017-07-10 NOTE — ED Provider Notes (Signed)
[facility] EMERGENCY DEPARTMENT Provider Note   CSN: [id number] Arrival date & time: 07/10/17  4098     History   Chief Complaint Chief Complaint  Patient presents with  . Near Syncope  . Abdominal Pain    HPI Hector Wilcox is a 42 y.o. male.  HPI  42 year old male presents with near syncope and abdominal pain.  He states his been having right flank/right lower quadrant abdominal pain for 2 weeks or so.  It seems to come and go.  Sometimes seems to be worse about an hour or more after eating.  He gets a bloated feeling and has been having extra gas.  Has been burping a lot despite using Tums.  He is also had loose and sometimes diarrheal stools.  No blood.  He had 2 loose bowel movements today.  While at work, he states his legs felt weak and he felt lightheaded.  After sitting down the seem to go away.  There is no chest pain or shortness of breath.  No headache or focal weakness.  With rest, his legs are feeling better.  He has not noticed any dysuria or hematuria.  The pain in his right side is not that bad and currently is only a 3 out of 10.  Past Medical History:  Diagnosis Date  . Acid reflux   . Sickle cell trait (HCC)     There are no active problems to display for this patient.   History reviewed. No pertinent surgical history.      Home Medications    Prior to Admission medications   Medication Sig Start Date End Date Taking? Authorizing Provider  alum & mag hydroxide-simeth (MAALOX PLUS) 400-400-40 MG/5ML suspension Take 15 mLs by mouth as needed for indigestion.   Yes [provider]  bismuth subsalicylate (PEPTO-BISMOL) 262 MG/15ML suspension Take 30 mLs by mouth 4 (four) times daily -  before meals and at bedtime. 01/13/15  Yes Lyndal Pulley, MD  Cal Carb-Mag Hydrox-Simeth (ROLAIDS ADVANCED PO) Take 3 tablets by mouth as needed (heartburn).   Yes [provider]  cetirizine (ZYRTEC) 10 MG tablet Take 10 mg by mouth daily  as needed for allergies.   Yes [provider]  hydroxypropyl methylcellulose / hypromellose (ISOPTO TEARS / GONIOVISC) 2.5 % ophthalmic solution Place 1 drop into both eyes as needed for dry eyes.   Yes [provider]  famotidine (PEPCID) 20 MG tablet Take 1 tablet (20 mg total) by mouth 2 (two) times daily. 07/10/17   Pricilla Loveless, MD  fluticasone (FLONASE) 50 MCG/ACT nasal spray Place 2 sprays into both nostrils daily. Patient not taking: Reported on 07/10/2017 09/27/16   Belinda Fisher, PA-C  Lactobacillus-Inulin (CULTURELLE DIGESTIVE HEALTH) CAPS Take 1 capsule by mouth 3 (three) times daily. Patient not taking: Reported on 01/13/2015 08/13/14   Palumbo, April, MD  metroNIDAZOLE (FLAGYL) 500 MG tablet Take 1 tablet (500 mg total) by mouth 4 (four) times daily. 01/13/15   Lyndal Pulley, MD  pantoprazole (PROTONIX) 40 MG tablet Take 1 tablet (40 mg total) by mouth daily. 07/10/17   Pricilla Loveless, MD  sucralfate (CARAFATE) 1 g tablet Take 1 tablet (1 g total) by mouth 4 (four) times daily -  with meals and at bedtime. 07/10/17   Pricilla Loveless, MD    Family History History reviewed. No pertinent family history.  Social History Social History   Tobacco Use  . Smoking status: Current Every Day Smoker    Packs/day:  0.25    Years: 13.00    Pack years: 3.25    Types: Cigars  . Smokeless tobacco: Never Used  Substance Use Topics  . Alcohol use: Yes    Alcohol/week: 2.4 oz    Types: 4 Cans of beer per week  . Drug use: No     Allergies   Tramadol   Review of Systems Review of Systems  Constitutional: Negative for fever.  Respiratory: Negative for shortness of breath.   Cardiovascular: Negative for chest pain.  Gastrointestinal: Positive for diarrhea. Negative for vomiting.  Genitourinary: Positive for flank pain. Negative for dysuria and hematuria.  Musculoskeletal: Negative for back pain.  Neurological: Positive for light-headedness.  All other systems reviewed  and are negative.    Physical Exam Updated Vital Signs BP 100/89   Pulse 62   Temp 98.1 F (36.7 C) (Oral)   Resp (!) 21   Ht  (1.753 m)   Wt 81.2 kg (179 lb)   SpO2 99%   BMI 26.43 kg/m   Physical Exam  Constitutional: He is oriented to person, place, and time. He appears well-developed and well-nourished.  Non-toxic appearance. He does not appear ill.  HENT:  Head: Normocephalic and atraumatic.  Right Ear: External ear normal.  Left Ear: External ear normal.  Nose: Nose normal.  Eyes: Pupils are equal, round, and reactive to light. EOM are normal. Right eye exhibits no discharge. Left eye exhibits no discharge.  Neck: Neck supple.  Cardiovascular: Normal rate, regular rhythm and normal heart sounds.  Pulmonary/Chest: Effort normal and breath sounds normal.  Abdominal: Soft. There is tenderness (mild, hard to localize and reproduce). There is no CVA tenderness.    Musculoskeletal: He exhibits no edema.  Neurological: He is alert and oriented to person, place, and time.  CN 3-12 grossly intact. 5/5 strength in all 4 extremities. Grossly normal sensation. Normal finger to nose.   Skin: Skin is warm and dry.  Nursing note and vitals reviewed.    ED Treatments / Results  Labs (all labs ordered are listed, but only abnormal results are displayed) Labs Reviewed  URINALYSIS, ROUTINE W REFLEX MICROSCOPIC - Abnormal; Notable for the following components:      Result Value   Hgb urine dipstick SMALL (*)    All other components within normal limits  HEPATIC FUNCTION PANEL - Abnormal; Notable for the following components:   Bilirubin, Direct <0.1 (*)    All other components within normal limits  BASIC METABOLIC PANEL  CBC  LIPASE, BLOOD    EKG EKG Interpretation  Date/Time:  Thursday Jul 10 2017 09:27:45 EDT Ventricular Rate:  76 PR Interval:  154 QRS Duration: 84 QT Interval:  374 QTC Calculation: 420 R Axis:   76 Text Interpretation:  Normal sinus rhythm  no acute ST/T changes no significant change since Dec 2016 Confirmed by Pricilla Loveless [phone]) on 07/10/2017 3:21:15 PM   Radiology Ct Renal Stone Study  Result Date: 07/10/2017 CLINICAL DATA:  Std/ full stone Right flank pain x 2 weeks, microscopic hematuria No prior abd surgery EXAM: CT ABDOMEN AND PELVIS WITHOUT CONTRAST TECHNIQUE: Multidetector CT imaging of the abdomen and pelvis was performed following the standard protocol without IV contrast. COMPARISON:  None. FINDINGS: Lower chest: There is minimal bibasilar atelectasis. Heart size is normal. Hepatobiliary: No focal liver abnormality is seen. No radiopaque gallstones, biliary dilatation, or pericholecystic inflammatory changes. Pancreas: Unremarkable. No pancreatic ductal dilatation or surrounding inflammatory changes. Spleen: Normal in size without focal abnormality.  Adrenals/Urinary Tract: Adrenal glands are normal in appearance. A LOWER pole cyst in the RIGHT kidney is 1.5 centimeters. There is no hydronephrosis. No intrarenal or ureteral stones. Urinary bladder is unremarkable in appearance. Stomach/Bowel: The stomach and small bowel loops are normal in appearance. The appendix is well seen and has a normal appearance. There are scattered colonic diverticula not associated with acute diverticulitis. Vascular/Lymphatic: There is atherosclerotic calcification of the abdominal aorta. No aneurysm. No retroperitoneal or mesenteric adenopathy. Reproductive: There is calcification of the prostate gland, otherwise normal in appearance. Other: No abdominal wall hernia or abnormality. No abdominopelvic ascites. Musculoskeletal: No acute or significant osseous findings. IMPRESSION: 1.  No evidence for acute  abnormality. 2.  Aortic atherosclerosis.  (ICD10-I70.0) 3. Benign RIGHT LOWER pole cyst. 4. No nephrolithiasis or urinary tract obstruction. Electronically Signed   By: Norva Pavlov M.D.   On: 07/10/2017 16:27    Procedures Procedures (including  critical care time)  Medications Ordered in ED Medications - No data to display   Initial Impression / Assessment and Plan / ED Course  I have reviewed the triage vital signs and the nursing notes.  Pertinent labs & imaging results that were available during my care of the patient were reviewed by me and considered in my medical decision making (see chart for details).     No clear cause for the patient's right-sided abdominal pain.  This is vague and hard to localize.  I doubt cholecystitis.  Given the area of pain and small hematuria seen in urine, CT stone study obtained which shows no obvious ureteral stone or appendicitis.  He is well-appearing.  He declines medicine for pain right now.  He primarily is belching a lot and has a long-standing history of GERD. Treat with multiple medicines and follow-up with a PCP.  Return precautions.   Final Clinical Impressions(s) / ED Diagnoses   Final diagnoses:  Right sided abdominal pain  Near syncope  Renal cyst    ED Discharge Orders        Ordered    famotidine (PEPCID) 20 MG tablet  2 times daily     07/10/17 1644    sucralfate (CARAFATE) 1 g tablet  3 times daily with meals & bedtime     07/10/17 1644    pantoprazole (PROTONIX) 40 MG tablet  Daily     07/10/17 1644       Pricilla Loveless, MD 07/10/17 2226

## 2017-07-10 NOTE — ED Notes (Signed)
Lab to add on Hepatic and lipase

## 2017-07-10 NOTE — ED Notes (Signed)
Gave Pt urine cup for sample

## 2017-07-10 NOTE — ED Notes (Signed)
Pt very agitated by wait time stating he was going to starve and this is crazy he has to wait so long.

## 2018-02-11 DIAGNOSIS — I639 Cerebral infarction, unspecified: Secondary | ICD-10-CM

## 2018-02-11 HISTORY — DX: Cerebral infarction, unspecified: I63.9

## 2018-03-17 ENCOUNTER — Inpatient Hospital Stay (HOSPITAL_COMMUNITY)
Admission: EM | Admit: 2018-03-17 | Discharge: 2018-03-20 | DRG: 066 | Disposition: A | Discharge status: Home / Self Care | Payer: Self-pay | Attending: Family Medicine | Admitting: Family Medicine

## 2018-03-17 ENCOUNTER — Other Ambulatory Visit: Payer: Self-pay

## 2018-03-17 ENCOUNTER — Observation Stay (HOSPITAL_COMMUNITY): Payer: Self-pay

## 2018-03-17 ENCOUNTER — Emergency Department (HOSPITAL_COMMUNITY): Payer: Self-pay

## 2018-03-17 ENCOUNTER — Encounter (HOSPITAL_COMMUNITY): Payer: Self-pay | Admitting: Emergency Medicine

## 2018-03-17 DIAGNOSIS — Z79899 Other long term (current) drug therapy: Secondary | ICD-10-CM

## 2018-03-17 DIAGNOSIS — Z832 Family history of diseases of the blood and blood-forming organs and certain disorders involving the immune mechanism: Secondary | ICD-10-CM

## 2018-03-17 DIAGNOSIS — F17290 Nicotine dependence, other tobacco product, uncomplicated: Secondary | ICD-10-CM | POA: Diagnosis present

## 2018-03-17 DIAGNOSIS — F17210 Nicotine dependence, cigarettes, uncomplicated: Secondary | ICD-10-CM | POA: Diagnosis present

## 2018-03-17 DIAGNOSIS — D573 Sickle-cell trait: Secondary | ICD-10-CM | POA: Diagnosis present

## 2018-03-17 DIAGNOSIS — Z79891 Long term (current) use of opiate analgesic: Secondary | ICD-10-CM

## 2018-03-17 DIAGNOSIS — E785 Hyperlipidemia, unspecified: Secondary | ICD-10-CM | POA: Diagnosis present

## 2018-03-17 DIAGNOSIS — K219 Gastro-esophageal reflux disease without esophagitis: Secondary | ICD-10-CM | POA: Diagnosis present

## 2018-03-17 DIAGNOSIS — I10 Essential (primary) hypertension: Secondary | ICD-10-CM | POA: Diagnosis present

## 2018-03-17 DIAGNOSIS — I63531 Cerebral infarction due to unspecified occlusion or stenosis of right posterior cerebral artery: Principal | ICD-10-CM

## 2018-03-17 DIAGNOSIS — H53462 Homonymous bilateral field defects, left side: Secondary | ICD-10-CM | POA: Diagnosis present

## 2018-03-17 DIAGNOSIS — Z7951 Long term (current) use of inhaled steroids: Secondary | ICD-10-CM

## 2018-03-17 DIAGNOSIS — F1010 Alcohol abuse, uncomplicated: Secondary | ICD-10-CM | POA: Diagnosis present

## 2018-03-17 DIAGNOSIS — I639 Cerebral infarction, unspecified: Secondary | ICD-10-CM

## 2018-03-17 DIAGNOSIS — Z720 Tobacco use: Secondary | ICD-10-CM

## 2018-03-17 LAB — CBC
HCT: 43.9 % (ref 39.0–52.0)
Hemoglobin: 15.1 g/dL (ref 13.0–17.0)
MCH: 30.4 pg (ref 26.0–34.0)
MCHC: 34.4 g/dL (ref 30.0–36.0)
MCV: 88.3 fL (ref 80.0–100.0)
PLATELETS: 199 10*3/uL (ref 150–400)
RBC: 4.97 MIL/uL (ref 4.22–5.81)
RDW: 12.9 % (ref 11.5–15.5)
WBC: 6.2 10*3/uL (ref 4.0–10.5)
nRBC: 0 % (ref 0.0–0.2)

## 2018-03-17 LAB — PROTIME-INR
INR: 1.02
PROTHROMBIN TIME: 13.3 s (ref 11.4–15.2)

## 2018-03-17 LAB — COMPREHENSIVE METABOLIC PANEL
ALT: 22 U/L (ref 0–44)
AST: 25 U/L (ref 15–41)
Albumin: 3.9 g/dL (ref 3.5–5.0)
Alkaline Phosphatase: 62 U/L (ref 38–126)
Anion gap: 4 — ABNORMAL LOW (ref 5–15)
BUN: 10 mg/dL (ref 6–20)
CO2: 24 mmol/L (ref 22–32)
Calcium: 8.7 mg/dL — ABNORMAL LOW (ref 8.9–10.3)
Chloride: 109 mmol/L (ref 98–111)
Creatinine, Ser: 0.82 mg/dL (ref 0.61–1.24)
GFR calc non Af Amer: >60 mL/min (ref >60)
Glucose, Bld: 105 mg/dL — ABNORMAL HIGH (ref 70–99)
Potassium: 4 mmol/L (ref 3.5–5.1)
Sodium: 137 mmol/L (ref 135–145)
Total Bilirubin: 0.5 mg/dL (ref 0.3–1.2)
Total Protein: 6.7 g/dL (ref 6.5–8.1)

## 2018-03-17 LAB — DIFFERENTIAL
Abs Immature Granulocytes: 0.02 10*3/uL (ref 0.00–0.07)
Basophils Absolute: 0.1 10*3/uL (ref 0.0–0.1)
Basophils Relative: 1 %
Eosinophils Absolute: 0.1 10*3/uL (ref 0.0–0.5)
Eosinophils Relative: 2 %
Immature Granulocytes: 0 %
Lymphocytes Relative: 33 %
Lymphs Abs: 2 10*3/uL (ref 0.7–4.0)
MONO ABS: 0.4 10*3/uL (ref 0.1–1.0)
MONOS PCT: 7 %
Neutro Abs: 3.5 10*3/uL (ref 1.7–7.7)
Neutrophils Relative %: 57 %

## 2018-03-17 LAB — APTT: aPTT: 26 seconds (ref 24–36)

## 2018-03-17 LAB — I-STAT TROPONIN, ED: Troponin i, poc: 0.01 ng/mL (ref 0.00–0.08)

## 2018-03-17 LAB — ETHANOL

## 2018-03-17 MED ORDER — SODIUM CHLORIDE (PF) 0.9 % IJ SOLN
INTRAMUSCULAR | Status: AC
  Filled 2018-03-17: qty 50

## 2018-03-17 MED ORDER — STROKE: EARLY STAGES OF RECOVERY BOOK
Freq: Once | Status: AC
  Administered 2018-03-17: 23:00:00
  Filled 2018-03-17: qty 1

## 2018-03-17 MED ORDER — IOPAMIDOL (ISOVUE-370) INJECTION 76%
INTRAVENOUS | Status: AC
  Filled 2018-03-17: qty 100

## 2018-03-17 MED ORDER — ASPIRIN 300 MG RE SUPP: 300.0000 mg | Freq: Every day | RECTAL | Status: DC

## 2018-03-17 MED ORDER — ENOXAPARIN SODIUM 40 MG/0.4ML ~~LOC~~ SOLN
40.0000 mg | SUBCUTANEOUS | Status: DC
  Filled 2018-03-17: qty 0.4

## 2018-03-17 MED ORDER — ACETAMINOPHEN 325 MG PO TABS
650.0000 mg | ORAL_TABLET | ORAL | Status: DC | PRN
  Administered 2018-03-19: 650 mg via ORAL
  Filled 2018-03-17: qty 2

## 2018-03-17 MED ORDER — PROCHLORPERAZINE EDISYLATE 10 MG/2ML IJ SOLN
10.0000 mg | Freq: Once | INTRAMUSCULAR | Status: AC
  Administered 2018-03-17: 10 mg via INTRAVENOUS
  Filled 2018-03-17: qty 2

## 2018-03-17 MED ORDER — ENOXAPARIN SODIUM 40 MG/0.4ML ~~LOC~~ SOLN
40.0000 mg | SUBCUTANEOUS | Status: DC
  Administered 2018-03-17: 40 mg via SUBCUTANEOUS
  Filled 2018-03-17: qty 0.4

## 2018-03-17 MED ORDER — DIPHENHYDRAMINE HCL 50 MG/ML IJ SOLN
25.0000 mg | Freq: Once | INTRAMUSCULAR | Status: AC
  Administered 2018-03-17: 25 mg via INTRAVENOUS
  Filled 2018-03-17: qty 1

## 2018-03-17 MED ORDER — IOPAMIDOL (ISOVUE-370) INJECTION 76%
100.0000 mL | Freq: Once | INTRAVENOUS | Status: AC | PRN
  Administered 2018-03-17: 80 mL via INTRAVENOUS

## 2018-03-17 MED ORDER — ACETAMINOPHEN 160 MG/5ML PO SOLN: 650.0000 mg | ORAL | Status: DC | PRN

## 2018-03-17 MED ORDER — ACETAMINOPHEN 650 MG RE SUPP: 650.0000 mg | RECTAL | Status: DC | PRN

## 2018-03-17 MED ORDER — ASPIRIN 325 MG PO TABS
325.0000 mg | ORAL_TABLET | Freq: Every day | ORAL | Status: DC
  Administered 2018-03-17 – 2018-03-19 (×3): 325 mg via ORAL
  Filled 2018-03-17 (×4): qty 1

## 2018-03-17 MED ORDER — SODIUM CHLORIDE 0.9 % IV BOLUS
1000.0000 mL | Freq: Once | INTRAVENOUS | Status: AC
  Administered 2018-03-17: 1000 mL via INTRAVENOUS

## 2018-03-17 MED ORDER — DEXAMETHASONE SODIUM PHOSPHATE 10 MG/ML IJ SOLN
10.0000 mg | Freq: Once | INTRAMUSCULAR | Status: AC
  Administered 2018-03-17: 10 mg via INTRAVENOUS
  Filled 2018-03-17: qty 1

## 2018-03-17 MED ORDER — SENNOSIDES-DOCUSATE SODIUM 8.6-50 MG PO TABS: 1.0000 | ORAL_TABLET | Freq: Every evening | ORAL | Status: DC | PRN

## 2018-03-17 NOTE — H&P (Addendum)
H&P        History and Physical    Hector Wilcox ZOX:[id number] DOB: 05/21/1975 DOA: 03/17/2018  PCP: Patient, No Pcp Per  Patient coming from: home  I have personally briefly reviewed patient's old medical [facility]  Chief Complaint: HA  HPI: Hector Wilcox is a 43 y.o. male with medical history significant of GERD presents to the ER with visual changes and  headache.  Last time normal was on Saturday, when he woke up for a severe generalized headache.  He noted blurry vision bilaterally.  He denies any dizziness nausea vomiting numbness or tingling or weakness of his extremities.  He has had migraines in the past but this headache was different per report.  He did have some alcohol on Friday night.  He does smoke cigarettes but denies any  illegal drug use. 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  ED provider spoke with Dr. Anise SalvokirPatrick neurology who requests [facility] for further evaluation and treatment.  CTA head and neck has been ordered and pending.  Patient received a migraine cocktail with Decadron IV Benadryl and Compazine.  With improvement of his headache.   Review of Systems: + Ha, visual changes, otherwise unobtainable due to patient's current sedation  Past Medical History:  Diagnosis Date  . Acid reflux   . Sickle cell trait (HCC)     History reviewed. No pertinent surgical history.   reports that he has been smoking cigars. He has a 3.25 pack-year smoking history. He has never used smokeless tobacco. He reports current alcohol use of about 4.0 standard drinks of alcohol per week. He reports that he does not use drugs.  Allergies  Allergen Reactions  . Tramadol Swelling    lips    History reviewed. No pertinent family history. No stroke history  Prior to Admission medications   Medication Sig Start Date End Date Taking? Authorizing Provider  alum & mag hydroxide-simeth (MAALOX PLUS) 400-400-40 MG/5ML suspension Take 15 mLs  by mouth as needed for indigestion.   Yes [provider]  Cal Carb-Mag Hydrox-Simeth (ROLAIDS ADVANCED PO) Take 3 tablets by mouth as needed (heartburn).   Yes [provider]  cetirizine (ZYRTEC) 10 MG tablet Take 10 mg by mouth daily as needed for allergies.   Yes [provider]  hydroxypropyl methylcellulose / hypromellose (ISOPTO TEARS / GONIOVISC) 2.5 % ophthalmic solution Place 1 drop into both eyes as needed for dry eyes.   Yes [provider]  bismuth subsalicylate (PEPTO-BISMOL) 262 MG/15ML suspension Take 30 mLs by mouth 4 (four) times daily -  before meals and at bedtime. Patient not taking: Reported on 03/17/2018 01/13/15   Lyndal PulleyKnott, Daniel, MD  famotidine (PEPCID) 20 MG tablet Take 1 tablet (20 mg total) by mouth 2 (two) times daily. Patient not taking: Reported on 03/17/2018 07/10/17   Pricilla LovelessGoldston, Scott, MD  fluticasone [facility](FLONASE) 50 MCG/ACT nasal spray Place 2 sprays into both nostrils daily. Patient not taking: Reported on 07/10/2017 09/27/16   Belinda FisherYu, Amy V, PA-C  pantoprazole (PROTONIX) 40 MG tablet Take 1 tablet (40 mg total) by mouth daily. Patient not taking: Reported on 03/17/2018 07/10/17   Pricilla LovelessGoldston, Scott, MD  sucralfate (CARAFATE) 1 g tablet Take 1 tablet (1 g total) by mouth 4 (four) times daily -  with meals and at bedtime. Patient not taking: Reported on 03/17/2018 07/10/17   Pricilla LovelessGoldston, Scott, MD    Physical Exam: Vitals:   03/17/18  1529 03/17/18 1830 03/17/18 2106 03/17/18 2320  BP: (!) 133/95 (!) 161/97 (!) 146/89 134/67  Pulse: 96 86 63 68  Resp: 18 (!) 22 17 14   Temp: 98.2 F (36.8 C)     TempSrc: Oral     SpO2: 98% 99% 96% 98%    Constitutional: asleep  Vitals:   03/17/18 1529 03/17/18 1830 03/17/18 2106 03/17/18 2320  BP: (!) 133/95 (!) 161/97 (!) 146/89 134/67  Pulse: 96 86 63 68  Resp: 18 (!) 22 17 14   Temp: 98.2 F (36.8 C)     TempSrc: Oral     SpO2: 98% 99% 96% 98%   Eyes: , lids  normal Neck: normal, supple, no  masses Respiratory: clear to auscultation bilaterally, no wheezing, no crackles. Normal respiratory effort. No accessory muscle use.  Cardiovascular: Regular rate and rhythm, no murmurs / rubs / gallops. No extremity edema. 2+ pedal pulses.  Abdomen: no tenderness, no masses palpated.  Bowel sounds positive.  Musculoskeletal: no clubbing / cyanosis. No joint deformity upper and lower extremities.  Skin: no rashes, lesions, ulcers. No induration Neurologic: asleep unable to eval  Psychiatric: asleep      Labs on Admission: I have personally reviewed following labs and imaging studies  CBC: Recent Labs  Lab 03/17/18 2053  WBC 6.2  NEUTROABS 3.5  HGB 15.1  HCT 43.9  MCV 88.3  PLT 199   Basic Metabolic Panel: Recent Labs  Lab 03/17/18 2053  NA 137  K 4.0  CL 109  CO2 24  GLUCOSE 105*  BUN 10  CREATININE 0.82  CALCIUM 8.7*   GFR: CrCl cannot be calculated (Unknown ideal weight.). Liver Function Tests: Recent Labs  Lab 03/17/18 2053  AST 25  ALT 22  ALKPHOS 62  BILITOT 0.5  PROT 6.7  ALBUMIN 3.9   No results for input(s): LIPASE, AMYLASE in the last 168 hours. No results for input(s): AMMONIA in the last 168 hours. Coagulation Profile: Recent Labs  Lab 03/17/18 2053  INR 1.02   Cardiac Enzymes: No results for input(s): CKTOTAL, CKMB, CKMBINDEX, TROPONINI in the last 168 hours. BNP (last 3 results) No results for input(s): PROBNP in the last 8760 hours. HbA1C: No results for input(s): HGBA1C in the last 72 hours. CBG: No results for input(s): GLUCAP in the last 168 hours. Lipid Profile: No results for input(s): CHOL, HDL, LDLCALC, TRIG, CHOLHDL, LDLDIRECT in the last 72 hours. Thyroid Function Tests: No results for input(s): TSH, T4TOTAL, FREET4, T3FREE, THYROIDAB in the last 72 hours. Anemia Panel: No results for input(s): VITAMINB12, FOLATE, FERRITIN, TIBC, IRON, RETICCTPCT in the last 72 hours. Urine analysis:    Component Value Date/Time    COLORURINE YELLOW 07/10/2017 1205   APPEARANCEUR CLEAR 07/10/2017 1205   LABSPEC 1.018 07/10/2017 1205   PHURINE 6.0 07/10/2017 1205   GLUCOSEU NEGATIVE 07/10/2017 1205   HGBUR SMALL (A) 07/10/2017 1205   BILIRUBINUR NEGATIVE 07/10/2017 1205   KETONESUR NEGATIVE 07/10/2017 1205   PROTEINUR NEGATIVE 07/10/2017 1205   UROBILINOGEN 1.0 08/13/2014 0315   NITRITE NEGATIVE 07/10/2017 1205   LEUKOCYTESUR NEGATIVE 07/10/2017 1205    Radiological Exams on Admission: Ct Head Wo Contrast  Result Date: 03/17/2018 CLINICAL DATA:  Headaches and bilateral blurred vision for 4 days. EXAM: CT HEAD WITHOUT CONTRAST TECHNIQUE: Contiguous axial images were obtained from the base of the skull through the vertex without intravenous contrast. COMPARISON:  None. FINDINGS: Brain: Focal poorly defined low-attenuation area with associated sulcal effacement in the right medial occipital  lobe consistent with acute infarct in the distribution of the right posterior cerebral artery. Abscess or tumor would be a less likely consideration. MRI correlation is suggested. No acute intracranial hemorrhage. Otherwise, ventricles and sulci are symmetrical. No abnormal extra-axial fluid collections. Gray-white matter junctions are distinct. Basal cisterns are not effaced. No ventricular dilatation. Vascular: No hyperdense vessel or unexpected calcification. Skull: Calvarium appears intact. Sinuses/Orbits: Paranasal sinuses and mastoid air cells are clear. Other: None. IMPRESSION: Acute infarct in the distribution of the right posterior cerebral artery. No acute intracranial hemorrhage. Electronically Signed   By: Burman NievesWilliam  Stevens M.D.   On: 03/17/2018 20:25   Mr Brain Wo Contrast  Result Date: 03/17/2018 CLINICAL DATA:  Follow-up examination for acute stroke. EXAM: MRI HEAD WITHOUT CONTRAST TECHNIQUE: Multiplanar, multiecho pulse sequences of the brain and surrounding structures were obtained without intravenous contrast. COMPARISON:   Prior head CT from earlier same day. FINDINGS: Brain: Confluent diffusion abnormality involving the parasagittal cortical gray matter of the right temporal occipital region compatible with evolving right PCA territory infarct, largely acute in appearance. Localized gyral edema without significant regional mass effect. No associated hemorrhage. No other evidence for acute or subacute ischemia. Underlying cerebral volume is normal. No significant cerebral white matter changes. No mass lesion, midline shift or mass effect. No hydrocephalus. No extra-axial fluid collection. Pituitary gland suprasellar region normal. Midline structures intact and normal. Vascular: Major intracranial vascular flow voids are maintained. Skull and upper cervical spine: Craniocervical junction normal. Upper cervical spine within normal limits. Bone marrow signal intensity normal. No scalp soft tissue abnormality. Sinuses/Orbits: Globes and orbital soft tissues within normal limits. Paranasal sinuses are clear. No mastoid effusion. Inner ear structures normal. Other: None. IMPRESSION: 1. 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 No associated hemorrhage or significant regional mass effect. 2. Otherwise normal brain MRI. Electronically Signed   By: Rise MuBenjamin  McClintock M.D.   On: 03/17/2018 22:30    EKG: Independently reviewed.  Sinus rhythm no acute ST or T wave abnormalities  Assessment/Plan Principal Problem:   Stroke [facility](HCC)  tobacco abuse  -Antiplatelet therapy with aspirin , MRI brain, CTA brain and neck arteries, 2D echo.  Pending monitor on telemetry, fasting lipid panel, consult rehab therapies, dysphagia screen.  Neurology consult -Advise smoking cessation    DVT prophylaxis: SCD Code Status: Full  Disposition Plan: Home 1 to 2 days Consults called: ED provider called neurology Dr. Amada JupiterKirkpatrick  Admission status: Office telemetry    Karis Emig Johnson-Pitts MD [facility] Pager [phone]  If  7PM-7AM, please contact night-coverage [email or web address] Password [email or web address]  03/17/2018, 11:50 PM

## 2018-03-17 NOTE — ED Provider Notes (Signed)
Medical screening examination/treatment/procedure(s) were conducted as a shared visit with non-physician practitioner(s) and myself.  I personally evaluated the patient during the encounter. Briefly, the patient is a 43 y.o. male with no significant medical history presents the ED with headache, blurred vision.  Patient states symptoms have occurred over the last 4 days.  Patient had CT scan done prior to my evaluation that showed an acute stroke.  Patient with right posterior cerebral artery stroke.  Patient is a smoker.  Likely has not followed up with a primary care doctor in a while.  No history of stroke in the family.  Neurologic exam is overall unremarkable.  Given new stroke will admit to hospitalist service for further evaluation.  Neurology has been consulted.  Patient feeling comfortable after headache cocktail.  This chart was dictated using voice recognition software.  Despite best efforts to proofread,  errors can occur which can change the documentation meaning.     EKG Interpretation  Date/Time:  Tuesday March 17 2018 20:59:20 EST Ventricular Rate:  61 PR Interval:    QRS Duration: 91 QT Interval:  385 QTC Calculation: 388 R Axis:   26 Text Interpretation:  Sinus rhythm ST elev, probable normal early repol pattern Baseline wander in lead(s) V2 V3 V5 V6 No significant change since last tracing Confirmed by Virgina Norfolk [phone]) on 03/17/2018 9:34:12 PM          Virgina Norfolk, DO 03/17/18 2134

## 2018-03-17 NOTE — ED Triage Notes (Signed)
Patient c/o headache with bilateral blurred vision x4 days. Denies weakness. Ambulatory.

## 2018-03-17 NOTE — ED Notes (Signed)
Pt stated that his headache is gone.

## 2018-03-17 NOTE — ED Provider Notes (Signed)
[facility]-EMERGENCY DEPT Provider Note   CSN: [id number] Arrival date & time: 03/17/18  1455     History   Chief Complaint Chief Complaint  Patient presents with  . Headache    HPI KINDRICK LANKFORD is a 43 y.o. male.  TYSIN SALADA is a 43 y.o. male with a history of GERD, sickle cell trait and migraines, who presents to the emergency department for evaluation of headache.  Patient reports that Saturday morning he woke up with a severe generalized headache.  He reports with headache he noted blurred vision bilaterally.  He reports it feels like "there is a gray cloud around my vision".  He denies any diplopia or change to his visual fields.  No eye pain, does have some photophobia.  He reports no dizziness, nausea or vomiting.  He denies any facial asymmetry, speech difficulty, numbness tingling or weakness in any of his extremities.  He reports history of migraines, but this headache feels different and much more severe.  He has not taken anything to treat his symptoms prior to arrival.  Reports headache started Saturday morning, Friday night he had been out drinking but otherwise felt normal with no visual changes.  Denies any chest pain, shortness of breath, abdominal pain.  Patient reports tobacco use, denies other drug use.  No history of hypertension or diabetes.      Past Medical History:  Diagnosis Date  . Acid reflux   . Sickle cell trait [facility])     Patient Active Problem List   Diagnosis Date Noted  . Stroke [facility]) 03/17/2018     History reviewed. No pertinent surgical history.      Home Medications    Prior to Admission medications   Medication Sig Start Date End Date Taking? Authorizing Provider  alum & mag hydroxide-simeth (MAALOX PLUS) 400-400-40 MG/5ML suspension Take 15 mLs by mouth as needed for indigestion.   Yes [provider]  Cal Carb-Mag Hydrox-Simeth (ROLAIDS ADVANCED PO) Take 3 tablets by mouth as needed (heartburn).    Yes [provider]  cetirizine (ZYRTEC) 10 MG tablet Take 10 mg by mouth daily as needed for allergies.   Yes [provider]  hydroxypropyl methylcellulose / hypromellose (ISOPTO TEARS / GONIOVISC) 2.5 % ophthalmic solution Place 1 drop into both eyes as needed for dry eyes.   Yes [provider]  bismuth subsalicylate (PEPTO-BISMOL) 262 MG/15ML suspension Take 30 mLs by mouth 4 (four) times daily -  before meals and at bedtime. Patient not taking: Reported on 03/17/2018 01/13/15   Lyndal Pulley, MD  famotidine (PEPCID) 20 MG tablet Take 1 tablet (20 mg total) by mouth 2 (two) times daily. Patient not taking: Reported on 03/17/2018 07/10/17   Pricilla Loveless, MD  fluticasone [facility]) 50 MCG/ACT nasal spray Place 2 sprays into both nostrils daily. Patient not taking: Reported on 07/10/2017 09/27/16   Belinda Fisher, PA-C  pantoprazole (PROTONIX) 40 MG tablet Take 1 tablet (40 mg total) by mouth daily. Patient not taking: Reported on 03/17/2018 07/10/17   Pricilla Loveless, MD  sucralfate (CARAFATE) 1 g tablet Take 1 tablet (1 g total) by mouth 4 (four) times daily -  with meals and at bedtime. Patient not taking: Reported on 03/17/2018 07/10/17   Pricilla Loveless, MD    Family History History reviewed. No pertinent family history.  Social History Social History   Tobacco Use  . Smoking status: Current Every Day Smoker    Packs/day: 0.25  Years: 13.00    Pack years: 3.25    Types: Cigars  . Smokeless tobacco: Never Used  Substance Use Topics  . Alcohol use: Yes    Alcohol/week: 4.0 standard drinks    Types: 4 Cans of beer per week  . Drug use: No     Allergies   Tramadol   Review of Systems Review of Systems  Constitutional: Negative for chills and fever.  HENT: Negative.   Eyes: Positive for photophobia and visual disturbance. Negative for pain.  Respiratory: Negative for cough and shortness of breath.   Cardiovascular: Negative for chest pain.    Gastrointestinal: Negative for abdominal pain, nausea and vomiting.  Musculoskeletal: Negative for arthralgias, myalgias, neck pain and neck stiffness.  Skin: Negative for color change and rash.  Neurological: Positive for headaches. Negative for dizziness, tremors, seizures, syncope, facial asymmetry, speech difficulty, weakness, light-headedness and numbness.  All other systems reviewed and are negative.    Physical Exam Updated Vital Signs BP (!) 133/95 (BP Location: Right Arm)   Pulse 96   Temp 98.2 F (36.8 C) (Oral)   Resp 18   SpO2 98%   Physical Exam Vitals signs and nursing note reviewed.  Constitutional:      General: He is not in acute distress.    Appearance: He is well-developed. He is not diaphoretic.  HENT:     Head: Normocephalic and atraumatic.  Eyes:     General: No visual field deficit.       Right eye: No discharge.        Left eye: No discharge.     Extraocular Movements: Extraocular movements intact.     Pupils: Pupils are equal, round, and reactive to light.     Comments: Visual Acuity  Right Eye Distance: 20/40 Left Eye Distance: 20/70 Bilateral Distance: 20/40  Right Eye Near: R Near: 20/70 Left Eye Near:  L Near: 20/50 Bilateral Near:  20/40 (Does not wear corrective lenses)  PERRLA and EOMs intact bilaterally with no nystagmus  Neck:     Musculoskeletal: Neck supple.  Cardiovascular:     Rate and Rhythm: Normal rate and regular rhythm.     Heart sounds: Normal heart sounds. No murmur. No friction rub. No gallop.   Pulmonary:     Effort: Pulmonary effort is normal. No respiratory distress.     Breath sounds: Normal breath sounds. No wheezing or rales.     Comments: Respirations equal and unlabored, patient able to speak in full sentences, lungs clear to auscultation bilaterally Abdominal:     General: Bowel sounds are normal. There is no distension.     Palpations: Abdomen is soft. There is no mass.     Tenderness: There is no  abdominal tenderness. There is no guarding.     Comments: Abdomen soft, nondistended, nontender to palpation in all quadrants without guarding or peritoneal signs  Musculoskeletal:        General: No deformity.     Comments: Bilateral lower extremities without swelling or tenderness.  Skin:    General: Skin is warm and dry.     Capillary Refill: Capillary refill takes less than 2 seconds.  Neurological:     Mental Status: He is alert and oriented to person, place, and time.     GCS: GCS eye subscore is 4. GCS verbal subscore is 5. GCS motor subscore is 6.     Cranial Nerves: No cranial nerve deficit.     Sensory: No sensory deficit.  Coordination: Coordination normal.     Comments: Speech is clear, able to follow commands CN III-XII intact Normal strength in upper and lower extremities bilaterally including dorsiflexion and plantar flexion, strong and equal grip strength Sensation normal to light and sharp touch Moves extremities without ataxia, coordination intact   Psychiatric:        Mood and Affect: Mood normal.        Behavior: Behavior normal.      ED Treatments / Results  Labs (all labs ordered are listed, but only abnormal results are displayed) Labs Reviewed  COMPREHENSIVE METABOLIC PANEL - Abnormal; Notable for the following components:      Result Value   Glucose, Bld 105 (*)    Calcium 8.7 (*)    Anion gap 4 (*)    All other components within normal limits  ETHANOL  PROTIME-INR  APTT  CBC  DIFFERENTIAL  RAPID URINE DRUG SCREEN, HOSP PERFORMED  URINALYSIS, ROUTINE W REFLEX MICROSCOPIC  HIV ANTIBODY (ROUTINE TESTING W REFLEX)  HEMOGLOBIN A1C  LIPID PANEL  I-STAT TROPONIN, ED    EKG EKG Interpretation  Date/Time:  Tuesday March 17 2018 20:59:20 EST Ventricular Rate:  61 PR Interval:    QRS Duration: 91 QT Interval:  385 QTC Calculation: 388 R Axis:   26 Text Interpretation:  Sinus rhythm ST elev, probable normal early repol pattern Baseline  wander in lead(s) V2 V3 V5 V6 No significant change since last tracing Confirmed by Virgina NorfolkAdam, Curatolo [phone]) on 03/17/2018 9:34:12 PM   Radiology Ct Head Wo Contrast  Result Date: 03/17/2018 CLINICAL DATA:  Headaches and bilateral blurred vision for 4 days. EXAM: CT HEAD WITHOUT CONTRAST TECHNIQUE: Contiguous axial images were obtained from the base of the skull through the vertex without intravenous contrast. COMPARISON:  None. FINDINGS: Brain: Focal poorly defined low-attenuation area with associated sulcal effacement in the right medial occipital lobe consistent with acute infarct in the distribution of the right posterior cerebral artery. Abscess or tumor would be a less likely consideration. MRI correlation is suggested. No acute intracranial hemorrhage. Otherwise, ventricles and sulci are symmetrical. No abnormal extra-axial fluid collections. Gray-white matter junctions are distinct. Basal cisterns are not effaced. No ventricular dilatation. Vascular: No hyperdense vessel or unexpected calcification. Skull: Calvarium appears intact. Sinuses/Orbits: Paranasal sinuses and mastoid air cells are clear. Other: None. IMPRESSION: Acute infarct in the distribution of the right posterior cerebral artery. No acute intracranial hemorrhage. Electronically Signed   By: Burman NievesWilliam  Stevens M.D.   On: 03/17/2018 20:25    Procedures Procedures (including critical care time)  Medications Ordered in ED Medications  sodium chloride 0.9 % bolus 1,000 mL (0 mLs Intravenous Stopped 03/17/18 2040)  prochlorperazine (COMPAZINE) injection 10 mg (10 mg Intravenous Given 03/17/18 1931)  diphenhydrAMINE (BENADRYL) injection 25 mg (25 mg Intravenous Given 03/17/18 1930)  dexamethasone (DECADRON) injection 10 mg (10 mg Intravenous Given 03/17/18 1932)     Initial Impression / Assessment and Plan / ED Course  I have reviewed the triage vital signs and the nursing notes.  Pertinent labs & imaging results that were available during  my care of the patient were reviewed by me and considered in my medical decision making (see chart for details).  43 year old male presents for evaluation of sudden onset severe headache that occurred Saturday morning when he woke up.  He reports with headache he started to notice some blurring of his vision in both eyes and reports that it is like there is a gray cloud around his  vision.  He denies any other focal neurologic symptoms.  Aside from visual acuity being decreased with 20/70 vision at distance bilaterally no other focal neurologic deficits noted on exam, no visual field cut.  Patient does have a history of migraines but reports headache is more severe.  Presentation concerning for possible intracranial hemorrhage, mass, stroke or venous sinus thrombosis.  Will start with Noncon head CT and give IV fluids, Compazine, Benadryl and Decadron for symptom management.  CT shows acute infarct in the distribution of the right posterior cerebral artery.  Will get stroke labs and EKG and consult neurology.  Discussed results of CT with patient he reports headache has significantly improved after treatment.  Repeat neurologic exam with no focal deficits.  Case discussed with Dr. Amada JupiterKirkpatrick with neurology who recommends hospitalist admission, patient will need CTA of the head and neck and will ultimately need MRI and [facility].  Case discussed with Dr. Thayer DallasJohnson Pitts with [facility] who will see and admit the patient.  Final Clinical Impressions(s) / ED Diagnoses   Final diagnoses:  Acute right PCA stroke [facility](HCC)    ED Discharge Orders    None       Legrand RamsFord, Garv Kuechle N, PA-C 03/18/18 Jani [id number]    Curatolo, Adam, DO 03/18/18 0725

## 2018-03-17 NOTE — ED Notes (Signed)
Pt is back from MRI.

## 2018-03-17 NOTE — ED Notes (Signed)
Pt unable to provide urine sample at this time 

## 2018-03-17 NOTE — ED Notes (Signed)
Patient transported to CT 

## 2018-03-17 NOTE — ED Notes (Addendum)
Patient transported to MRI 

## 2018-03-18 ENCOUNTER — Inpatient Hospital Stay (HOSPITAL_COMMUNITY): Payer: Self-pay

## 2018-03-18 ENCOUNTER — Other Ambulatory Visit: Payer: Self-pay

## 2018-03-18 DIAGNOSIS — I639 Cerebral infarction, unspecified: Secondary | ICD-10-CM

## 2018-03-18 DIAGNOSIS — I63431 Cerebral infarction due to embolism of right posterior cerebral artery: Secondary | ICD-10-CM

## 2018-03-18 DIAGNOSIS — I63531 Cerebral infarction due to unspecified occlusion or stenosis of right posterior cerebral artery: Principal | ICD-10-CM

## 2018-03-18 LAB — URINALYSIS, ROUTINE W REFLEX MICROSCOPIC
BACTERIA UA: NONE SEEN
Bilirubin Urine: NEGATIVE
Glucose, UA: NEGATIVE mg/dL
Hgb urine dipstick: NEGATIVE
Ketones, ur: NEGATIVE mg/dL
Leukocytes, UA: NEGATIVE
Nitrite: NEGATIVE
Protein, ur: NEGATIVE mg/dL
Specific Gravity, Urine: >1.046 — ABNORMAL HIGH (ref 1.005–1.030)
pH: 6 (ref 5.0–8.0)

## 2018-03-18 LAB — HEMOGLOBIN A1C
Hgb A1c MFr Bld: 5.9 % — ABNORMAL HIGH (ref 4.8–5.6)
Mean Plasma Glucose: 122.63 mg/dL

## 2018-03-18 LAB — RAPID URINE DRUG SCREEN, HOSP PERFORMED
Amphetamines: NOT DETECTED
Barbiturates: NOT DETECTED
Benzodiazepines: NOT DETECTED
Cocaine: NOT DETECTED
OPIATES: NOT DETECTED
TETRAHYDROCANNABINOL: NOT DETECTED

## 2018-03-18 LAB — ECHOCARDIOGRAM COMPLETE

## 2018-03-18 LAB — LIPID PANEL
Cholesterol: 127 mg/dL (ref 0–200)
HDL: 34 mg/dL — ABNORMAL LOW (ref >40)
LDL Cholesterol: 86 mg/dL (ref 0–99)
Total CHOL/HDL Ratio: 3.7 RATIO
Triglycerides: 34 mg/dL (ref <150)
VLDL: 7 mg/dL (ref 0–40)

## 2018-03-18 LAB — HIV ANTIBODY (ROUTINE TESTING W REFLEX): HIV Screen 4th Generation wRfx: NONREACTIVE

## 2018-03-18 MED ORDER — FOLIC ACID 1 MG PO TABS
1.0000 mg | ORAL_TABLET | Freq: Every day | ORAL | Status: DC
  Administered 2018-03-18 – 2018-03-20 (×3): 1 mg via ORAL
  Filled 2018-03-18 (×3): qty 1

## 2018-03-18 MED ORDER — ADULT MULTIVITAMIN W/MINERALS CH
1.0000 | ORAL_TABLET | Freq: Every day | ORAL | Status: DC
  Administered 2018-03-18 – 2018-03-20 (×3): 1 via ORAL
  Filled 2018-03-18 (×3): qty 1

## 2018-03-18 MED ORDER — ATORVASTATIN CALCIUM 40 MG PO TABS
40.0000 mg | ORAL_TABLET | Freq: Every day | ORAL | Status: DC
  Administered 2018-03-18 – 2018-03-19 (×2): 40 mg via ORAL
  Filled 2018-03-18 (×2): qty 1

## 2018-03-18 MED ORDER — VITAMIN B-1 100 MG PO TABS
100.0000 mg | ORAL_TABLET | Freq: Every day | ORAL | Status: DC
  Administered 2018-03-18 – 2018-03-20 (×3): 100 mg via ORAL
  Filled 2018-03-18 (×3): qty 1

## 2018-03-18 MED ORDER — NICOTINE 21 MG/24HR TD PT24
21.0000 mg | MEDICATED_PATCH | Freq: Every day | TRANSDERMAL | Status: DC
  Administered 2018-03-18 – 2018-03-19 (×2): 21 mg via TRANSDERMAL
  Filled 2018-03-18 (×2): qty 1

## 2018-03-18 MED ORDER — SODIUM CHLORIDE 0.9 % IV SOLN: INTRAVENOUS | Status: DC

## 2018-03-18 MED ORDER — DEXTROSE-NACL 5-0.45 % IV SOLN
INTRAVENOUS | Status: DC
  Administered 2018-03-18: 20:00:00 via INTRAVENOUS

## 2018-03-18 MED ORDER — SODIUM CHLORIDE 0.9 % IV SOLN
INTRAVENOUS | Status: DC
  Administered 2018-03-19: 07:00:00 via INTRAVENOUS

## 2018-03-18 MED ORDER — THIAMINE HCL 100 MG/ML IJ SOLN: 100.0000 mg | Freq: Every day | INTRAMUSCULAR | Status: DC

## 2018-03-18 MED ORDER — ENOXAPARIN SODIUM 40 MG/0.4ML ~~LOC~~ SOLN
40.0000 mg | SUBCUTANEOUS | Status: DC
  Administered 2018-03-20: 40 mg via SUBCUTANEOUS
  Filled 2018-03-18 (×3): qty 0.4

## 2018-03-18 MED ORDER — LORAZEPAM 1 MG PO TABS: 1.0000 mg | ORAL_TABLET | Freq: Four times a day (QID) | ORAL | Status: DC | PRN

## 2018-03-18 MED ORDER — ALUM & MAG HYDROXIDE-SIMETH 200-200-20 MG/5ML PO SUSP
30.0000 mL | Freq: Once | ORAL | Status: AC
  Administered 2018-03-18: 30 mL via ORAL
  Filled 2018-03-18: qty 30

## 2018-03-18 MED ORDER — LORAZEPAM 2 MG/ML IJ SOLN: 1.0000 mg | Freq: Four times a day (QID) | INTRAMUSCULAR | Status: DC | PRN

## 2018-03-18 MED ORDER — LORAZEPAM 1 MG PO TABS
1.0000 mg | ORAL_TABLET | Freq: Every day | ORAL | Status: DC
  Administered 2018-03-18 – 2018-03-19 (×2): 1 mg via ORAL
  Filled 2018-03-18 (×2): qty 1

## 2018-03-18 NOTE — ED Notes (Signed)
Attempted to call report to [id number] [phone]. Per secretary: The RN taking report from me

## 2018-03-18 NOTE — Progress Notes (Signed)
  Echocardiogram 2D Echocardiogram has been performed.  Hector Wilcox 03/18/2018, 1:28 PM

## 2018-03-18 NOTE — ED Notes (Signed)
[facility]. Pt stable at transport.

## 2018-03-18 NOTE — ED Notes (Signed)
Pt updated on plan of care

## 2018-03-18 NOTE — Progress Notes (Signed)
[facility] PROGRESS NOTE  Hector Wilcox WRU:[id number] DOB: 07-18-1975 DOA: 03/17/2018   PCP: Patient, No Pcp Per     Assessment/Plan: Principal Problem:   Stroke (HCC)  42 y.o. male with medical history significant of GERD presents to the ER with visual changes and  headache.  Last time normal was on Saturday, when he woke up for a severe generalized headache.  He noted blurry vision bilaterally. In the ER  CT showed a new right PCA stroke.  ED provider spoke with Dr. Anise Salvo neurology who requests admission to [facility] for further evaluation and treatment.    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 No large vessel occlusion. No hemodynamically significant stenosis or other acute vascular abnormality. Risk factors include nicotine dependence, denies illicit drug use EKG shows normal sinus rhythm Continue serial enzymes, troponin negative 1 LDL 86, triglycerides 34, UDS negative,started on a statin  2-D echo pending PT/OT/speech Neurology consultation at [facility] Antiplatelet therapy-aspirin 325 mg by mouth daily  Nicotine dependence Smoking cessation counseling  GERD-continue PPI and Carafate    DVT prophylaxsis Lovenox  Code Status:  Full code      Family Communication: Discussed in detail with the patient, all imaging results, lab results explained to the patient   Disposition Plan:  Transferred to [place] for further workup     Consultants:  neurology  Procedures:  none  Antibiotics: Anti-infectives (From admission, onward)   None         HPI/Subjective: Patient complains of blurry vision, no other focal deficits  Objective: Vitals:   03/18/18 0511 03/18/18 0700 03/18/18 0712 03/18/18 0727  BP: 123/73   (!) 140/110  Pulse: 73 83 65 76  Resp: 15 18 14  (!) 24  Temp:      TempSrc:      SpO2: 93% 100% 100% 100%   No intake or output data in the 24 hours  ending 03/18/18 0749  Exam:  Examination:  General exam: Appears calm and comfortable  Respiratory system: Clear to auscultation. Respiratory effort normal. Cardiovascular system: S1 & S2 heard, RRR. No JVD, murmurs, rubs, gallops or clicks. No pedal edema. Gastrointestinal system: Abdomen is nondistended, soft and nontender. No organomegaly or masses felt. Normal bowel sounds heard. Central nervous system: Alert and oriented. No focal neurological deficits. Extremities: Symmetric 5 x 5 power. Skin: No rashes, lesions or ulcers Psychiatry: Judgement and insight appear normal. Mood & affect appropriate.     Data Reviewed: I have personally reviewed following labs and imaging studies  Micro Results No results found for this or any previous visit (from the past 240 hour(s)).  Radiology Reports Ct Angio Head W Or Wo Contrast  Result Date: 03/18/2018 CLINICAL DATA:  Initial evaluation for acute headache, blurry vision. Found to have acute right PCA territory stroke. EXAM: CT ANGIOGRAPHY HEAD AND NECK TECHNIQUE: Multidetector CT imaging of the head and neck was performed using the standard protocol during bolus administration of intravenous contrast. Multiplanar CT image reconstructions and MIPs were obtained to evaluate the vascular anatomy. Carotid stenosis measurements (when applicable) are obtained utilizing NASCET criteria, using the distal internal carotid diameter as the denominator. CONTRAST:  80mL ISOVUE-370 IOPAMIDOL (ISOVUE-370) INJECTION 76% COMPARISON:  Prior CT and MRI from earlier the same day. FINDINGS: CTA NECK FINDINGS Aortic arch: Visualized aortic arch of normal caliber with normal branch pattern. Mild atheromatous plaque within the arch itself. No  flow-limiting stenosis about the origin of the great vessels. Visualized subclavian arteries widely patent. Right carotid system: Right common and internal carotid arteries widely patent without stenosis, dissection, or occlusion.  Left carotid system: Left common and internal carotid arteries widely patent without stenosis, dissection, or occlusion. Vertebral arteries: Both of the vertebral arteries arise from the subclavian arteries. Proximal right vertebral artery not well visualized due to adjacent venous contamination. Vertebral arteries otherwise widely patent within the neck without stenosis, dissection, or occlusion. Skeleton: No acute osseous abnormality. No discrete lytic or blastic osseous lesions. Scattered dental caries noted. Other neck: No other acute soft tissue abnormality within the neck. Upper chest: Visualized upper chest demonstrates no acute finding. Paraseptal and centrilobular emphysematous changes noted. Review of the MIP images confirms the above findings CTA HEAD FINDINGS Anterior circulation: Internal carotid arteries widely patent to the termini without stenosis. A1 segments, anterior communicating artery common anterior cerebral arteries widely patent. M1 segments widely patent bilaterally. Distal MCA branches well perfused and symmetric. Posterior circulation: Vertebral arteries widely patent to the vertebrobasilar junction without stenosis. Right PICA patent. Left PICA not well seen. Basilar widely patent to its distal aspect without stenosis. Superior cerebral arteries patent bilaterally. Posterior cerebral arteries primarily supplied via the basilar, although small bilateral posterior communicating arteries noted. PCAs are patent to their distal aspects without proximal occlusion or stenosis. Venous sinuses: Patent. Anatomic variants: None significant. Delayed phase: Evolving right PCA territory infarct. No other abnormal enhancement. Review of the MIP images confirms the above findings IMPRESSION: 1. Negative CTA of the head and neck. No large vessel occlusion. No hemodynamically significant stenosis or other acute vascular abnormality. 2. Emphysema. Electronically Signed   By: Benjamin  McClintock M.D.   On:  03/18/2018 00:00   Ct Head Wo Contrast  Result Date: 03/17/2018 CLINICAL DATA:  Headaches and bilateral blurred vision for 4 days. EXAM: CT HEAD WITHOUT CONTRAST TECHNIQUE: Contiguous axial images were obtained from the base of the skull through the vertex without intravenous contrast. COMPARISON:  None. FINDINGS: Brain: Focal poorly defined low-attenuation area with associated sulcal effacement in the right medial occipital lobe consistent with acute infarct in the distribution of the right posterior cerebral artery. Abscess or tumor would be a less likely consideration. MRI correlation is suggested. No acute intracranial hemorrhage. Otherwise, ventricles and sulci are symmetrical. No abnormal extra-axial fluid collections. Gray-white matter junctions are distinct. Basal cisterns are not effaced. No ventricular dilatation. Vascular: No hyperdense vessel or unexpected calcification. Skull: Calvarium appears intact. Sinuses/Orbits: Paranasal sinuses and mastoid air cells are clear. Other: None. IMPRESSION: Acute infarct in the distribution of the right posterior cerebral artery. No acute intracranial hemorrhage. Electronically Signed   By: William  Stevens M.D.   On: 03/17/2018 20:25   Ct Angio Neck W And/or Wo Contrast  Result Date: 03/18/2018 CLINICAL DATA:  Initial evaluation for acute headache, blurry vision. Found to have acute right PCA territory stroke. EXAM: CT ANGIOGRAPHY HEAD AND NECK TECHNIQUE: Multidetector CT imaging of the head and neck was performed using the standard protocol during bolus administration of intravenous contrast. Multiplanar CT image reconstructions and MIPs were obtained to evaluate the vascular anatomy. Carotid stenosis measurements (when applicable) are obtained utilizing NASCET criteria, using the distal internal carotid diameter as the denominator. CONTRAST:  52822mmL ISOVUE-370 IOPAMIDOL (ISOVUE-370) INJECTION 76% COMPARISON:  Prior CT and MRI from earlier the same day.  FINDINGS: CTA NECK FINDINGS Aortic arch: Visualized aortic arch of normal caliber with normal branch pattern. Mild atheromatous plaque within  the arch itself. No flow-limiting stenosis about the origin of the great vessels. Visualized subclavian arteries widely patent. Right carotid system: Right common and internal carotid arteries widely patent without stenosis, dissection, or occlusion. Left carotid system: Left common and internal carotid arteries widely patent without stenosis, dissection, or occlusion. Vertebral arteries: Both of the vertebral arteries arise from the subclavian arteries. Proximal right vertebral artery not well visualized due to adjacent venous contamination. Vertebral arteries otherwise widely patent within the neck without stenosis, dissection, or occlusion. Skeleton: No acute osseous abnormality. No discrete lytic or blastic osseous lesions. Scattered dental caries noted. Other neck: No other acute soft tissue abnormality within the neck. Upper chest: Visualized upper chest demonstrates no acute finding. Paraseptal and centrilobular emphysematous changes noted. Review of the MIP images confirms the above findings CTA HEAD FINDINGS Anterior circulation: Internal carotid arteries widely patent to the termini without stenosis. A1 segments, anterior communicating artery common anterior cerebral arteries widely patent. M1 segments widely patent bilaterally. Distal MCA branches well perfused and symmetric. Posterior circulation: Vertebral arteries widely patent to the vertebrobasilar junction without stenosis. Right PICA patent. Left PICA not well seen. Basilar widely patent to its distal aspect without stenosis. Superior cerebral arteries patent bilaterally. Posterior cerebral arteries primarily supplied via the basilar, although small bilateral posterior communicating arteries noted. PCAs are patent to their distal aspects without proximal occlusion or stenosis. Venous sinuses: Patent. Anatomic  variants: None significant. Delayed phase: Evolving right PCA territory infarct. No other abnormal enhancement. Review of the MIP images confirms the above findings IMPRESSION: 1. Negative CTA of the head and neck. No large vessel occlusion. No hemodynamically significant stenosis or other acute vascular abnormality. 2. Emphysema. Electronically Signed   By: Rise MuBenjamin  McClintock M.D.   On: 03/18/2018 00:00   Mr Brain Wo Contrast  Result Date: 03/17/2018 CLINICAL DATA:  Follow-up examination for acute stroke. EXAM: MRI HEAD WITHOUT CONTRAST TECHNIQUE: Multiplanar, multiecho pulse sequences of the brain and surrounding structures were obtained without intravenous contrast. COMPARISON:  Prior head CT from earlier same day. FINDINGS: Brain: Confluent diffusion abnormality involving the parasagittal cortical gray matter of the right temporal occipital region compatible with evolving right PCA territory infarct, largely acute in appearance. Localized gyral edema without significant regional mass effect. No associated hemorrhage. No other evidence for acute or subacute ischemia. Underlying cerebral volume is normal. No significant cerebral white matter changes. No mass lesion, midline shift or mass effect. No hydrocephalus. No extra-axial fluid collection. Pituitary gland suprasellar region normal. Midline structures intact and normal. Vascular: Major intracranial vascular flow voids are maintained. Skull and upper cervical spine: Craniocervical junction normal. Upper cervical spine within normal limits. Bone marrow signal intensity normal. No scalp soft tissue abnormality. Sinuses/Orbits: Globes and orbital soft tissues within normal limits. Paranasal sinuses are clear. No mastoid effusion. Inner ear structures normal. Other: None. IMPRESSION: 1. 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 No associated hemorrhage or significant regional mass effect. 2. Otherwise normal brain MRI. Electronically Signed   By:  Rise MuBenjamin  McClintock M.D.   On: 03/17/2018 22:30     CBC Recent Labs  Lab 03/17/18 2053  WBC 6.2  HGB 15.1  HCT 43.9  PLT 199  MCV 88.3  MCH 30.4  MCHC 34.4  RDW 12.9  LYMPHSABS 2.0  MONOABS 0.4  EOSABS 0.1  BASOSABS 0.1    Chemistries  Recent Labs  Lab 03/17/18 2053  NA 137  K 4.0  CL 109  CO2 24  GLUCOSE 105*  BUN 10  CREATININE 0.82  CALCIUM 8.7*  AST 25  ALT 22  ALKPHOS 62  BILITOT 0.5   ------------------------------------------------------------------------------------------------------------------ CrCl cannot be calculated (Unknown ideal weight.). ------------------------------------------------------------------------------------------------------------------ No results for input(s): HGBA1C in the last 72 hours. ------------------------------------------------------------------------------------------------------------------ Recent Labs    03/18/18 0622  CHOL 127  HDL 34*  LDLCALC 86  TRIG 34  CHOLHDL 3.7   ------------------------------------------------------------------------------------------------------------------ No results for input(s): TSH, T4TOTAL, T3FREE, THYROIDAB in the last 72 hours.  Invalid input(s): FREET3 ------------------------------------------------------------------------------------------------------------------ No results for input(s): VITAMINB12, FOLATE, FERRITIN, TIBC, IRON, RETICCTPCT in the last 72 hours.  Coagulation profile Recent Labs  Lab 03/17/18 2053  INR 1.02    No results for input(s): DDIMER in the last 72 hours.  Cardiac Enzymes No results for input(s): CKMB, TROPONINI, MYOGLOBIN in the last 168 hours.  Invalid input(s): CK ------------------------------------------------------------------------------------------------------------------ Invalid input(s): POCBNP   CBG: No results for input(s): GLUCAP in the last 168 hours.     Studies: Ct Angio Head W Or Wo Contrast  Result Date:  03/18/2018 CLINICAL DATA:  Initial evaluation for acute headache, blurry vision. Found to have acute right PCA territory stroke. EXAM: CT ANGIOGRAPHY HEAD AND NECK TECHNIQUE: Multidetector CT imaging of the head and neck was performed using the standard protocol during bolus administration of intravenous contrast. Multiplanar CT image reconstructions and MIPs were obtained to evaluate the vascular anatomy. Carotid stenosis measurements (when applicable) are obtained utilizing NASCET criteria, using the distal internal carotid diameter as the denominator. CONTRAST:  80mL ISOVUE-370 IOPAMIDOL (ISOVUE-370) INJECTION 76% COMPARISON:  Prior CT and MRI from earlier the same day. FINDINGS: CTA NECK FINDINGS Aortic arch: Visualized aortic arch of normal caliber with normal branch pattern. Mild atheromatous plaque within the arch itself. No flow-limiting stenosis about the origin of the great vessels. Visualized subclavian arteries widely patent. Right carotid system: Right common and internal carotid arteries widely patent without stenosis, dissection, or occlusion. Left carotid system: Left common and internal carotid arteries widely patent without stenosis, dissection, or occlusion. Vertebral arteries: Both of the vertebral arteries arise from the subclavian arteries. Proximal right vertebral artery not well visualized due to adjacent venous contamination. Vertebral arteries otherwise widely patent within the neck without stenosis, dissection, or occlusion. Skeleton: No acute osseous abnormality. No discrete lytic or blastic osseous lesions. Scattered dental caries noted. Other neck: No other acute soft tissue abnormality within the neck. Upper chest: Visualized upper chest demonstrates no acute finding. Paraseptal and centrilobular emphysematous changes noted. Review of the MIP images confirms the above findings CTA HEAD FINDINGS Anterior circulation: Internal carotid arteries widely patent to the termini without  stenosis. A1 segments, anterior communicating artery common anterior cerebral arteries widely patent. M1 segments widely patent bilaterally. Distal MCA branches well perfused and symmetric. Posterior circulation: Vertebral arteries widely patent to the vertebrobasilar junction without stenosis. Right PICA patent. Left PICA not well seen. Basilar widely patent to its distal aspect without stenosis. Superior cerebral arteries patent bilaterally. Posterior cerebral arteries primarily supplied via the basilar, although small bilateral posterior communicating arteries noted. PCAs are patent to their distal aspects without proximal occlusion or stenosis. Venous sinuses: Patent. Anatomic variants: None significant. Delayed phase: Evolving right PCA territory infarct. No other abnormal enhancement. Review of the MIP images confirms the above findings IMPRESSION: 1. Negative CTA of the head and neck. No large vessel occlusion. No hemodynamically significant stenosis or other acute vascular abnormality. 2. Emphysema. Electronically Signed   By: Rise Mu M.D.   On: 03/18/2018 00:00   Ct Head Wo Contrast  Result Date: 03/17/2018 CLINICAL DATA:  Headaches and bilateral blurred vision for 4 days. EXAM: CT HEAD WITHOUT CONTRAST TECHNIQUE: Contiguous axial images were obtained from the base of the skull through the vertex without intravenous contrast. COMPARISON:  None. FINDINGS: Brain: Focal poorly defined low-attenuation area with associated sulcal effacement in the right medial occipital lobe consistent with acute infarct in the distribution of the right posterior cerebral artery. Abscess or tumor would be a less likely consideration. MRI correlation is suggested. No acute intracranial hemorrhage. Otherwise, ventricles and sulci are symmetrical. No abnormal extra-axial fluid collections. Gray-white matter junctions are distinct. Basal cisterns are not effaced. No ventricular dilatation. Vascular: No hyperdense  vessel or unexpected calcification. Skull: Calvarium appears intact. Sinuses/Orbits: Paranasal sinuses and mastoid air cells are clear. Other: None. IMPRESSION: Acute infarct in the distribution of the right posterior cerebral artery. No acute intracranial hemorrhage. Electronically Signed   By: Burman Nieves M.D.   On: 03/17/2018 20:25   Ct Angio Neck W And/or Wo Contrast  Result Date: 03/18/2018 CLINICAL DATA:  Initial evaluation for acute headache, blurry vision. Found to have acute right PCA territory stroke. EXAM: CT ANGIOGRAPHY HEAD AND NECK TECHNIQUE: Multidetector CT imaging of the head and neck was performed using the standard protocol during bolus administration of intravenous contrast. Multiplanar CT image reconstructions and MIPs were obtained to evaluate the vascular anatomy. Carotid stenosis measurements (when applicable) are obtained utilizing NASCET criteria, using the distal internal carotid diameter as the denominator. CONTRAST:  80mL ISOVUE-370 IOPAMIDOL (ISOVUE-370) INJECTION 76% COMPARISON:  Prior CT and MRI from earlier the same day. FINDINGS: CTA NECK FINDINGS Aortic arch: Visualized aortic arch of normal caliber with normal branch pattern. Mild atheromatous plaque within the arch itself. No flow-limiting stenosis about the origin of the great vessels. Visualized subclavian arteries widely patent. Right carotid system: Right common and internal carotid arteries widely patent without stenosis, dissection, or occlusion. Left carotid system: Left common and internal carotid arteries widely patent without stenosis, dissection, or occlusion. Vertebral arteries: Both of the vertebral arteries arise from the subclavian arteries. Proximal right vertebral artery not well visualized due to adjacent venous contamination. Vertebral arteries otherwise widely patent within the neck without stenosis, dissection, or occlusion. Skeleton: No acute osseous abnormality. No discrete lytic or blastic osseous  lesions. Scattered dental caries noted. Other neck: No other acute soft tissue abnormality within the neck. Upper chest: Visualized upper chest demonstrates no acute finding. Paraseptal and centrilobular emphysematous changes noted. Review of the MIP images confirms the above findings CTA HEAD FINDINGS Anterior circulation: Internal carotid arteries widely patent to the termini without stenosis. A1 segments, anterior communicating artery common anterior cerebral arteries widely patent. M1 segments widely patent bilaterally. Distal MCA branches well perfused and symmetric. Posterior circulation: Vertebral arteries widely patent to the vertebrobasilar junction without stenosis. Right PICA patent. Left PICA not well seen. Basilar widely patent to its distal aspect without stenosis. Superior cerebral arteries patent bilaterally. Posterior cerebral arteries primarily supplied via the basilar, although small bilateral posterior communicating arteries noted. PCAs are patent to their distal aspects without proximal occlusion or stenosis. Venous sinuses: Patent. Anatomic variants: None significant. Delayed phase: Evolving right PCA territory infarct. No other abnormal enhancement. Review of the MIP images confirms the above findings IMPRESSION: 1. Negative CTA of the head and neck. No large vessel occlusion. No hemodynamically significant stenosis or other acute vascular abnormality. 2. Emphysema. Electronically Signed   By: Rise Mu M.D.   On: 03/18/2018 00:00   Mr Brain  Wo Contrast  Result Date: 03/17/2018 CLINICAL DATA:  Follow-up examination for acute stroke. EXAM: MRI HEAD WITHOUT CONTRAST TECHNIQUE: Multiplanar, multiecho pulse sequences of the brain and surrounding structures were obtained without intravenous contrast. COMPARISON:  Prior head CT from earlier same day. FINDINGS: Brain: Confluent diffusion abnormality involving the parasagittal cortical gray matter of the right temporal occipital region  compatible with evolving right PCA territory infarct, largely acute in appearance. Localized gyral edema without significant regional mass effect. No associated hemorrhage. No other evidence for acute or subacute ischemia. Underlying cerebral volume is normal. No significant cerebral white matter changes. No mass lesion, midline shift or mass effect. No hydrocephalus. No extra-axial fluid collection. Pituitary gland suprasellar region normal. Midline structures intact and normal. Vascular: Major intracranial vascular flow voids are maintained. Skull and upper cervical spine: Craniocervical junction normal. Upper cervical spine within normal limits. Bone marrow signal intensity normal. No scalp soft tissue abnormality. Sinuses/Orbits: Globes and orbital soft tissues within normal limits. Paranasal sinuses are clear. No mastoid effusion. Inner ear structures normal. Other: None. IMPRESSION: 1. 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 No associated hemorrhage or significant regional mass effect. 2. Otherwise normal brain MRI. Electronically Signed   By: Rise Mu M.D.   On: 03/17/2018 22:30      No results found for: HGBA1C Lab Results  Component Value Date   LDLCALC 86 03/18/2018   CREATININE 0.82 03/17/2018       Scheduled Meds: . aspirin  300 mg Rectal Daily   Or  . aspirin  325 mg Oral Daily  . iopamidol      . sodium chloride (PF)       Continuous Infusions:   LOS: 0 days    Time spent: >30 MINS    Richarda Overlie  [facility] Pager [phone]. If 7PM-7AM, please contact night-coverage at [email or web address], password [facility] 03/18/2018, 7:49 AM  LOS: 0 days

## 2018-03-18 NOTE — H&P (View-Only) (Signed)
Stroke Neurology Consultation Note  Consult Requested by: Dr. Mariea ClontsEmokpae  Reason for Consult: stroke  Consult Date: 03/18/18  The history was obtained from the pt.  During history and examination, all items were able to obtain unless otherwise noted.  History of Present Illness:  Hector Wilcox is a 43 y.o. African American male with PMH of GERD, smoker, alcohol user, migraine admitted for HA and blurry vision.  Patient stated that Friday night he had drunk alcohol, smoked and eaten pizza. Saturday morning he woke up with severe b/l frontal HA and gray color vision for 10-15 secs followed with blurry vision bilaterally. Over the next a couple of days, HA gradually improving and location moved from frontal to the back of head, and blurry vision became more slow motion and delay images b/l but more on the left. [facility] and CT showed right PCA infarct. He also had MRI confirmed right PCA stroke and CTA head and neck unremarkable. [facility].  He denies any weakness, numbness, speech difficulty or seizure-like activity.  He denies any heart disease, only complains of GERD, especially at night.  He has been smoking, and light drinking and denies illicit drug use.  His father and brother has a sickle cell disease, and he was told to have sickle cell trait.  LSN: 03/13/18 night tPA Given: No: out of window, established stroke  Past Medical History:  Diagnosis Date  . Acid reflux   . Sickle cell trait (HCC)     History reviewed. No pertinent surgical history.  History reviewed. No pertinent family history.  Social History:  reports that he has been smoking cigars. He has a 3.25 pack-year smoking history. He has never used smokeless tobacco. He reports current alcohol use of about 4.0 standard drinks of alcohol per week. He reports that he does not use drugs.  Allergies:  Allergies  Allergen Reactions  . Tramadol Swelling    lips    No current  facility-administered medications on file prior to encounter.    Current Outpatient Medications on File Prior to Encounter  Medication Sig Dispense Refill  . alum & mag hydroxide-simeth (MAALOX PLUS) 400-400-40 MG/5ML suspension Take 15 mLs by mouth as needed for indigestion.    . Cal Carb-Mag Hydrox-Simeth (ROLAIDS ADVANCED PO) Take 3 tablets by mouth as needed (heartburn).    . cetirizine (ZYRTEC) 10 MG tablet Take 10 mg by mouth daily as needed for allergies.    . hydroxypropyl methylcellulose / hypromellose (ISOPTO TEARS / GONIOVISC) 2.5 % ophthalmic solution Place 1 drop into both eyes as needed for dry eyes.    Marland Kitchen. bismuth subsalicylate (PEPTO-BISMOL) 262 MG/15ML suspension Take 30 mLs by mouth 4 (four) times daily -  before meals and at bedtime. (Patient not taking: Reported on 03/17/2018) 1700 mL 0  . famotidine (PEPCID) 20 MG tablet Take 1 tablet (20 mg total) by mouth 2 (two) times daily. (Patient not taking: Reported on 03/17/2018) 30 tablet 0  . fluticasone (FLONASE) 50 MCG/ACT nasal spray Place 2 sprays into both nostrils daily. (Patient not taking: Reported on 07/10/2017) 1 g 0  . pantoprazole (PROTONIX) 40 MG tablet Take 1 tablet (40 mg total) by mouth daily. (Patient not taking: Reported on 03/17/2018) 30 tablet 0  . sucralfate (CARAFATE) 1 g tablet Take 1 tablet (1 g total) by mouth 4 (four) times daily -  with meals and at bedtime. (Patient not taking: Reported on 03/17/2018) 30 tablet 0  Review of Systems: A full ROS was attempted today and was able to be performed.  Systems assessed include - Constitutional, Eyes, HENT, Respiratory, Cardiovascular, Gastrointestinal, Genitourinary, Integument/breast, Hematologic/lymphatic, Musculoskeletal, Neurological, Behavioral/Psych, Endocrine, Allergic/Immunologic - with pertinent responses as per HPI.  Physical Examination: Temp:  [98 F (36.7 C)-98.2 F (36.8 C)] 98 F (36.7 C) (02/05 0840) Pulse Rate:  [55-96] 55 (02/05 0840) Resp:  [14-24]  20 (02/05 0840) BP: (115-161)/(64-110) 115/64 (02/05 0840) SpO2:  [93 %-100 %] 98 % (02/05 0840)  General - well nourished, well developed, in no apparent distress.    Ophthalmologic - fundi not visualized due to noncooperation.    Cardiovascular - regular rate and rhythm  Mental Status -  Level of arousal and orientation to time, place, and person were intact. Language including expression, naming, repetition, comprehension, reading, and writing was assessed and found intact. Attention span and concentration were normal. Fund of Knowledge was assessed and was intact.  Cranial Nerves II - XII - II - visual field testing showed left upper quadrantanopsia. III, IV, VI - Extraocular movements intact. V - Facial sensation intact bilaterally. VII - Facial movement intact bilaterally. VIII - Hearing & vestibular intact bilaterally. X - Palate elevates symmetrically. XI - Chin turning & shoulder shrug intact bilaterally. XII - Tongue protrusion intact.  Motor Strength - The patient's strength was normal in all extremities and pronator drift was absent.   Motor Tone & Bulk - Muscle tone was assessed at the neck and appendages and was normal.  Bulk was normal and fasciculations were absent.   Reflexes - The patient's reflexes were normal in all extremities and he had no pathological reflexes.  Sensory - Light touch, temperature/pinprick were assessed and were normal.    Coordination - The patient had normal movements in the hands and feet with no ataxia or dysmetria.  Tremor was absent.  Gait and Station - deferred  Data Reviewed: Ct Angio Head W Or Wo Contrast  Result Date: 03/18/2018 CLINICAL DATA:  Initial evaluation for acute headache, blurry vision. Found to have acute right PCA territory stroke. EXAM: CT ANGIOGRAPHY HEAD AND NECK TECHNIQUE: Multidetector CT imaging of the head and neck was performed using the standard protocol during bolus administration of intravenous contrast.  Multiplanar CT image reconstructions and MIPs were obtained to evaluate the vascular anatomy. Carotid stenosis measurements (when applicable) are obtained utilizing NASCET criteria, using the distal internal carotid diameter as the denominator. CONTRAST:  80mL ISOVUE-370 IOPAMIDOL (ISOVUE-370) INJECTION 76% COMPARISON:  Prior CT and MRI from earlier the same day. FINDINGS: CTA NECK FINDINGS Aortic arch: Visualized aortic arch of normal caliber with normal branch pattern. Mild atheromatous plaque within the arch itself. No flow-limiting stenosis about the origin of the great vessels. Visualized subclavian arteries widely patent. Right carotid system: Right common and internal carotid arteries widely patent without stenosis, dissection, or occlusion. Left carotid system: Left common and internal carotid arteries widely patent without stenosis, dissection, or occlusion. Vertebral arteries: Both of the vertebral arteries arise from the subclavian arteries. Proximal right vertebral artery not well visualized due to adjacent venous contamination. Vertebral arteries otherwise widely patent within the neck without stenosis, dissection, or occlusion. Skeleton: No acute osseous abnormality. No discrete lytic or blastic osseous lesions. Scattered dental caries noted. Other neck: No other acute soft tissue abnormality within the neck. Upper chest: Visualized upper chest demonstrates no acute finding. Paraseptal and centrilobular emphysematous changes noted. Review of the MIP images confirms the above findings CTA HEAD FINDINGS Anterior  circulation: Internal carotid arteries widely patent to the termini without stenosis. A1 segments, anterior communicating artery common anterior cerebral arteries widely patent. M1 segments widely patent bilaterally. Distal MCA branches well perfused and symmetric. Posterior circulation: Vertebral arteries widely patent to the vertebrobasilar junction without stenosis. Right PICA patent. Left  PICA not well seen. Basilar widely patent to its distal aspect without stenosis. Superior cerebral arteries patent bilaterally. Posterior cerebral arteries primarily supplied via the basilar, although small bilateral posterior communicating arteries noted. PCAs are patent to their distal aspects without proximal occlusion or stenosis. Venous sinuses: Patent. Anatomic variants: None significant. Delayed phase: Evolving right PCA territory infarct. No other abnormal enhancement. Review of the MIP images confirms the above findings IMPRESSION: 1. Negative CTA of the head and neck. No large vessel occlusion. No hemodynamically significant stenosis or other acute vascular abnormality. 2. Emphysema. Electronically Signed   By: Rise Mu M.D.   On: 03/18/2018 00:00   Ct Head Wo Contrast  Result Date: 03/17/2018 CLINICAL DATA:  Headaches and bilateral blurred vision for 4 days. EXAM: CT HEAD WITHOUT CONTRAST TECHNIQUE: Contiguous axial images were obtained from the base of the skull through the vertex without intravenous contrast. COMPARISON:  None. FINDINGS: Brain: Focal poorly defined low-attenuation area with associated sulcal effacement in the right medial occipital lobe consistent with acute infarct in the distribution of the right posterior cerebral artery. Abscess or tumor would be a less likely consideration. MRI correlation is suggested. No acute intracranial hemorrhage. Otherwise, ventricles and sulci are symmetrical. No abnormal extra-axial fluid collections. Gray-white matter junctions are distinct. Basal cisterns are not effaced. No ventricular dilatation. Vascular: No hyperdense vessel or unexpected calcification. Skull: Calvarium appears intact. Sinuses/Orbits: Paranasal sinuses and mastoid air cells are clear. Other: None. IMPRESSION: Acute infarct in the distribution of the right posterior cerebral artery. No acute intracranial hemorrhage. Electronically Signed   By: Burman Nieves M.D.    On: 03/17/2018 20:25   Ct Angio Neck W And/or Wo Contrast  Result Date: 03/18/2018 CLINICAL DATA:  Initial evaluation for acute headache, blurry vision. Found to have acute right PCA territory stroke. EXAM: CT ANGIOGRAPHY HEAD AND NECK TECHNIQUE: Multidetector CT imaging of the head and neck was performed using the standard protocol during bolus administration of intravenous contrast. Multiplanar CT image reconstructions and MIPs were obtained to evaluate the vascular anatomy. Carotid stenosis measurements (when applicable) are obtained utilizing NASCET criteria, using the distal internal carotid diameter as the denominator. CONTRAST:  80mL ISOVUE-370 IOPAMIDOL (ISOVUE-370) INJECTION 76% COMPARISON:  Prior CT and MRI from earlier the same day. FINDINGS: CTA NECK FINDINGS Aortic arch: Visualized aortic arch of normal caliber with normal branch pattern. Mild atheromatous plaque within the arch itself. No flow-limiting stenosis about the origin of the great vessels. Visualized subclavian arteries widely patent. Right carotid system: Right common and internal carotid arteries widely patent without stenosis, dissection, or occlusion. Left carotid system: Left common and internal carotid arteries widely patent without stenosis, dissection, or occlusion. Vertebral arteries: Both of the vertebral arteries arise from the subclavian arteries. Proximal right vertebral artery not well visualized due to adjacent venous contamination. Vertebral arteries otherwise widely patent within the neck without stenosis, dissection, or occlusion. Skeleton: No acute osseous abnormality. No discrete lytic or blastic osseous lesions. Scattered dental caries noted. Other neck: No other acute soft tissue abnormality within the neck. Upper chest: Visualized upper chest demonstrates no acute finding. Paraseptal and centrilobular emphysematous changes noted. Review of the MIP images confirms the above findings CTA  HEAD FINDINGS Anterior  circulation: Internal carotid arteries widely patent to the termini without stenosis. A1 segments, anterior communicating artery common anterior cerebral arteries widely patent. M1 segments widely patent bilaterally. Distal MCA branches well perfused and symmetric. Posterior circulation: Vertebral arteries widely patent to the vertebrobasilar junction without stenosis. Right PICA patent. Left PICA not well seen. Basilar widely patent to its distal aspect without stenosis. Superior cerebral arteries patent bilaterally. Posterior cerebral arteries primarily supplied via the basilar, although small bilateral posterior communicating arteries noted. PCAs are patent to their distal aspects without proximal occlusion or stenosis. Venous sinuses: Patent. Anatomic variants: None significant. Delayed phase: Evolving right PCA territory infarct. No other abnormal enhancement. Review of the MIP images confirms the above findings IMPRESSION: 1. Negative CTA of the head and neck. No large vessel occlusion. No hemodynamically significant stenosis or other acute vascular abnormality. 2. Emphysema. Electronically Signed   By: Rise Mu M.D.   On: 03/18/2018 00:00   Mr Brain Wo Contrast  Result Date: 03/17/2018 CLINICAL DATA:  Follow-up examination for acute stroke. EXAM: MRI HEAD WITHOUT CONTRAST TECHNIQUE: Multiplanar, multiecho pulse sequences of the brain and surrounding structures were obtained without intravenous contrast. COMPARISON:  Prior head CT from earlier same day. FINDINGS: Brain: Confluent diffusion abnormality involving the parasagittal cortical gray matter of the right temporal occipital region compatible with evolving right PCA territory infarct, largely acute in appearance. Localized gyral edema without significant regional mass effect. No associated hemorrhage. No other evidence for acute or subacute ischemia. Underlying cerebral volume is normal. No significant cerebral white matter changes. No mass  lesion, midline shift or mass effect. No hydrocephalus. No extra-axial fluid collection. Pituitary gland suprasellar region normal. Midline structures intact and normal. Vascular: Major intracranial vascular flow voids are maintained. Skull and upper cervical spine: Craniocervical junction normal. Upper cervical spine within normal limits. Bone marrow signal intensity normal. No scalp soft tissue abnormality. Sinuses/Orbits: Globes and orbital soft tissues within normal limits. Paranasal sinuses are clear. No mastoid effusion. Inner ear structures normal. Other: None. IMPRESSION: 1. 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 No associated hemorrhage or significant regional mass effect. 2. Otherwise normal brain MRI. Electronically Signed   By: Rise Mu M.D.   On: 03/17/2018 22:30    Assessment: 43 y.o. male with PMH of GERD, smoker, alcohol user, migraine admitted for HA and blurry vision.  Exam showed left upper quadrantanopsia. CT showed right PCA infarct, which confirmed on MRI. CTA head and neck unremarkable.  2D echo pending.  LDL 86 and A1c 5.9.  UDS negative.  Although he has stroke risk factor of smoking, alcohol, migraine, sickle cell trait, for his young age with unremarkable vasculature, further cardioembolic work-up is warranted.  Stroke Risk Factors - smoking and alcohol, migraine, sickle cell trait  Plan: -2D echo pending -LE venous Doppler pending to rule out DVT -TEE pending to rule out PFO or other cardioembolic source (NPO after midnight).  Did not order loop given his young age. -Hypercoagulable work-up pending - Prophylactic therapy-Antiplatelet med: Aspirin - dose 325 -On statin - Risk factor modification -Quit smoking and limit alcohol use - Telemetry monitoring - Frequent neuro checks -Stroke team to follow in a.m.  Thank you for this consultation and allowing [place] to participate in the care of this patient.  Marvel Plan, MD PhD Stroke  Neurology 03/18/2018 11:08 AM

## 2018-03-18 NOTE — ED Notes (Signed)
Merdis Delay, MD notified that pt passed stroke swallow screen

## 2018-03-18 NOTE — ED Notes (Signed)
Urine and culture sent to lab  

## 2018-03-18 NOTE — ED Notes (Signed)
[facility] notified need for transport 

## 2018-03-18 NOTE — Evaluation (Signed)
Occupational Therapy Evaluation Patient Details Name: Hector Wilcox MRN: [id number] DOB: 03-15-1975 Today's Date: 03/18/2018    History of Present Illness Hector Wilcox is a 43 y.o. male with medical history significant of GERD presents to the ER with visual changes and  headache.  Last time normal was on Saturday, when he woke up for a severe generalized headache.  He noted blurry vision bilaterally.  He denies any dizziness nausea vomiting numbness or tingling or weakness of his extremities.  He has had migraines in the past but this headache was different per report.  CT showed a new R PCA CVA   Clinical Impression   Pt presents to OT following R PCA CVA with c/o L visual disturbance. Pt at baseline and I with all ADLs and functional mobility despite visual disturbance. Pt reported being unable to see L side of therapist's face, however able to identify stimuli in all quadrants, with the only deficits being observed was incorrectly identifying number in L superior visual field. Unclear deficits visually, but feel no further OT needs at this time, as pt is able to compensate and aware of deficits. Recommended pt f/u with a vision specialist.     Follow Up Recommendations  No OT follow up    Equipment Recommendations  None recommended by OT    Recommendations for Other Services  None     Precautions / Restrictions Precautions Precautions: Other (comment)(L visual disturbance )      Mobility Bed Mobility Overal bed mobility: Independent                Transfers Overall transfer level: Independent                    Balance Overall balance assessment: Modified Independent                                         ADL either performed or assessed with clinical judgement   ADL Overall ADL's : At baseline;Independent                                             Vision Baseline Vision/History: Wears glasses(Stated he wore glasses  but couldn't state what for) Patient Visual Report: Blurring of vision Vision Assessment?: Yes Eye Alignment: Within Functional Limits Ocular Range of Motion: Within Functional Limits Alignment/Gaze Preference: Within Defined Limits Tracking/Visual Pursuits: Able to track stimulus in all quads without difficulty Saccades: Within functional limits Convergence: Within functional limits Visual Fields: Impaired-to be further tested in functional context;Left visual field deficit Additional Comments: Pt reports large L visual field deficit however unable to ellicit any deficits with extensive testing     Perception Perception Perception Tested?: Yes Spatial deficits: [facility]   Praxis Praxis Praxis tested?: Within functional limits    Pertinent Vitals/Pain Pain Assessment: No/denies pain     Hand Dominance Right   Extremity/Trunk Assessment Upper Extremity Assessment Upper Extremity Assessment: Overall WFL for tasks assessed   Lower Extremity Assessment Lower Extremity Assessment: Defer to PT evaluation   Cervical / Trunk Assessment Cervical / Trunk Assessment: Normal   Communication Communication Communication: No difficulties   Cognition Arousal/Alertness: Awake/alert Behavior During Therapy: WFL for tasks assessed/performed Overall Cognitive Status: Within Functional Limits for tasks assessed  Home Living Family/patient expects to be discharged to:: Private residence Living Arrangements: Parent Available Help at Discharge: Family;Available PRN/intermittently Type of Home: House Home Access: Level entry     Home Layout: One level     Bathroom Shower/Tub: Chief Strategy OfficerTub/shower unit   Bathroom Toilet: Standard     Home Equipment: None          Prior Functioning/Environment Level of Independence: Independent                 OT Problem List: Impaired vision/perception         OT Goals(Current  goals can be found in the care plan section) Acute Rehab OT Goals Patient Stated Goal: "see better" OT Goal Formulation: All assessment and education complete, DC therapy   AM-PAC OT "6 Clicks" Daily Activity     Outcome Measure Help from another person eating meals?: None Help from another person taking care of personal grooming?: None Help from another person toileting, which includes using toliet, bedpan, or urinal?: None Help from another person bathing (including washing, rinsing, drying)?: None Help from another person to put on and taking off regular upper body clothing?: None Help from another person to put on and taking off regular lower body clothing?: None 6 Click Score: 24   End of Session Nurse Communication: Mobility status;Precautions  Activity Tolerance: Patient tolerated treatment well Patient left: in bed;with call bell/phone within reach;with family/visitor present  OT Visit Diagnosis: Low vision, both eyes (H54.2)                Time: 1520-1539 OT Time Calculation (min): 19 min Charges:  OT General Charges $OT Visit: 1 Visit OT Evaluation $OT Eval Low Complexity: 1 Low  Crissie ReeseSandra H Verneda Hollopeter OTR/L  03/18/2018, 3:49 PM

## 2018-03-18 NOTE — Progress Notes (Signed)
Bilateral lower extremity venous duplex. Refer to "CV Proc" under chart review to view preliminary results.  03/18/2018 1:39 PM Gertie Fey, MHA, RVT, RDCS, RDMS

## 2018-03-18 NOTE — Progress Notes (Signed)
Patient arrived to 3w-32 @ 0830

## 2018-03-18 NOTE — Evaluation (Signed)
Speech Language Pathology Evaluation Patient Details Name: Hector Wilcox MRN: [id number] DOB: [birth year] Today's Date: 03/18/2018 Time: 9678-9381 SLP Time Calculation (min) (ACUTE ONLY): 25 min  Problem List:  Patient Active Problem List   Diagnosis Date Noted  . Stroke [facility]) 03/17/2018   Past Medical History:  Past Medical History:  Diagnosis Date  . Acid reflux   . Sickle cell trait (HCC)    Past Surgical History: History reviewed. No pertinent surgical history. HPI:  Pt is a 43 yo male admitted with headache and acute vision changes, found to have R PCA CVA. PMH: GERD, smoker, alcohol user, migraine    Assessment / Plan / Recommendation Clinical Impression  Pt scored a 24/30 on the [facility] but denies any acute changes in cognition. When errors were reviewed with pt he acknowledged that these were baseline difficulties, primarily with delayed recall and calculations. He also shares that he had limited years of schooling. Given that he is at his baseline level of function, no acute SLP f/u indicated.    SLP Assessment  SLP Recommendation/Assessment: Patient does not need any further Speech Lanaguage Pathology Services SLP Visit Diagnosis: Cognitive communication deficit (R41.841)    Follow Up Recommendations  None    Frequency and Duration           SLP Evaluation Cognition  Overall Cognitive Status: Within Functional Limits for tasks assessed(mild impairments but at baseline per report)       Comprehension  Auditory Comprehension Overall Auditory Comprehension: Appears within functional limits for tasks assessed    Expression Expression Primary Mode of Expression: Verbal Verbal Expression Overall Verbal Expression: Appears within functional limits for tasks assessed Written Expression Dominant Hand: Right   Oral / Motor  Motor Speech Overall Motor Speech: Appears within functional limits for tasks assessed   GO                    Hector Wilcox 03/18/2018, 5:12  PM  Hector Wilcox, M.A. CCC-SLP Acute Herbalist [phone] Office [phone]

## 2018-03-18 NOTE — ED Notes (Signed)
Attempted to call report to [facility] 3W. RN to call back for report.

## 2018-03-18 NOTE — ED Notes (Signed)
ED TO INPATIENT HANDOFF REPORT  Name/Age/Gender Hector Wilcox 43 y.o. male  Code Status    Code Status Orders  (From admission, onward)         Start     Ordered   03/17/18 2123  Full code  Continuous     03/17/18 2122        Code Status History    This patient has a current code status but no historical code status.      Home/SNF/Other Home  Chief Complaint Blurred Vison; Headache  Level of Care/Admitting Diagnosis ED Disposition    ED Disposition Condition Comment   Admit  Hospital Area: [facility] [[id number]]  Level of Care: Cardiac Telemetry [103]  I expect the patient will be discharged within 24 hours: No (not a candidate for 5C-Observation unit)  Diagnosis: Stroke [facility]) [[id number]]  Admitting Physician: Synetta Fail [[id number]]  Attending Physician: Synetta Fail [[id number]]  PT Class (Do Not Modify): Observation [104]  PT Acc Code (Do Not Modify): Observation [10022]       Medical History Past Medical History:  Diagnosis Date  . Acid reflux   . Sickle cell trait (HCC)     Allergies Allergies  Allergen Reactions  . Tramadol Swelling    lips    IV Location/Drains/Wounds Patient Lines/Drains/Airways Status   Active Line/Drains/Airways    Name:   Placement date:   Placement time:   Site:   Days:   Peripheral IV 03/17/18 Right Antecubital   03/17/18    1928    Antecubital   1          Labs/Imaging Results for orders placed or performed during the hospital encounter of 03/17/18 (from the past 48 hour(s))  Ethanol     Status: None   Collection Time: 03/17/18  8:53 PM  Result Value Ref Range   Alcohol, Ethyl (B) <10 <10 mg/dL    Comment: (NOTE) Lowest detectable limit for serum alcohol is 10 mg/dL. For medical purposes only. Performed at [facility], [address], [place], [place] [address]   Protime-INR     Status: None   Collection Time: 03/17/18  8:53 PM  Result Value Ref Range    Prothrombin Time 13.3 11.4 - 15.2 seconds   INR 1.02     Comment: Performed at [facility] [place], [address] [place], [place] [address]  APTT     Status: None   Collection Time: 03/17/18  8:53 PM  Result Value Ref Range   aPTT 26 24 - 36 seconds    Comment: Performed at [facility], [address] [place], [place] [address]  CBC     Status: None   Collection Time: 03/17/18  8:53 PM  Result Value Ref Range   WBC 6.2 4.0 - 10.5 K/uL   RBC 4.97 4.22 - 5.81 MIL/uL   Hemoglobin 15.1 13.0 - 17.0 g/dL   HCT 08.6 76.1 - 95.0 %   MCV 88.3 80.0 - 100.0 fL   MCH 30.4 26.0 - 34.0 pg   MCHC 34.4 30.0 - 36.0 g/dL   RDW 93.2 67.1 - 24.5 %   Platelets 199 150 - 400 K/uL   nRBC 0.0 0.0 - 0.2 %    Comment: Performed at [facility], [address] [place], [place] [address]  Differential     Status: None   Collection Time: 03/17/18  8:53 PM  Result Value Ref Range   Neutrophils Relative %  57 %   Neutro Abs 3.5 1.7 - 7.7 K/uL   Lymphocytes Relative 33 %   Lymphs Abs 2.0 0.7 - 4.0 K/uL   Monocytes Relative 7 %   Monocytes Absolute 0.4 0.1 - 1.0 K/uL   Eosinophils Relative 2 %   Eosinophils Absolute 0.1 0.0 - 0.5 K/uL   Basophils Relative 1 %   Basophils Absolute 0.1 0.0 - 0.1 K/uL   Immature Granulocytes 0 %   Abs Immature Granulocytes 0.02 0.00 - 0.07 K/uL    Comment: Performed at [facility], [address], [place], [place] [address]  Comprehensive metabolic panel     Status: Abnormal   Collection Time: 03/17/18  8:53 PM  Result Value Ref Range   Sodium 137 135 - 145 mmol/L   Potassium 4.0 3.5 - 5.1 mmol/L   Chloride 109 98 - 111 mmol/L   CO2 24 22 - 32 mmol/L   Glucose, Bld 105 (H) 70 - 99 mg/dL   BUN 10 6 - 20 mg/dL   Creatinine, Ser 6.04 0.61 - 1.24 mg/dL   Calcium 8.7 (L) 8.9 - 10.3 mg/dL   Total Protein 6.7 6.5 - 8.1 g/dL   Albumin 3.9 3.5 - 5.0 g/dL   AST 25 15 - 41 U/L   ALT 22 0 - 44 U/L    Alkaline Phosphatase 62 38 - 126 U/L   Total Bilirubin 0.5 0.3 - 1.2 mg/dL   GFR calc non Af Amer >60 >60 mL/min   GFR calc Af Amer >60 >60 mL/min   Anion gap 4 (L) 5 - 15    Comment: Performed at [facility], [address], [place], [place] [address]  I-stat troponin, ED     Status: None   Collection Time: 03/17/18  8:58 PM  Result Value Ref Range   Troponin i, poc 0.01 0.00 - 0.08 ng/mL   Comment 3            Comment: Due to the release kinetics of cTnI, a negative result within the first hours of the onset of symptoms does not rule out myocardial infarction with certainty. If myocardial infarction is still suspected, repeat the test at appropriate intervals.   Urine rapid drug screen (hosp performed)     Status: None   Collection Time: 03/18/18  2:37 AM  Result Value Ref Range   Opiates NONE DETECTED NONE DETECTED   Cocaine NONE DETECTED NONE DETECTED   Benzodiazepines NONE DETECTED NONE DETECTED   Amphetamines NONE DETECTED NONE DETECTED   Tetrahydrocannabinol NONE DETECTED NONE DETECTED   Barbiturates NONE DETECTED NONE DETECTED    Comment: (NOTE) DRUG SCREEN FOR MEDICAL PURPOSES ONLY.  IF CONFIRMATION IS NEEDED FOR ANY PURPOSE, NOTIFY LAB WITHIN 5 DAYS. LOWEST DETECTABLE LIMITS FOR URINE DRUG SCREEN Drug Class                     Cutoff (ng/mL) Amphetamine and metabolites    1000 Barbiturate and metabolites    200 Benzodiazepine                 200 Tricyclics and metabolites     300 Opiates and metabolites        300 Cocaine and metabolites        300 THC                            50 Performed at [facility], [address], [place], [place] [address]   Urinalysis, Routine w reflex microscopic     Status: Abnormal   Collection Time: 03/18/18  2:37 AM  Result Value Ref Range   Color, Urine YELLOW YELLOW   APPearance CLEAR CLEAR   Specific Gravity, Urine >1.046 (H) 1.005 - 1.030   pH 6.0 5.0 - 8.0   Glucose, UA NEGATIVE  NEGATIVE mg/dL   Hgb urine dipstick NEGATIVE NEGATIVE   Bilirubin Urine NEGATIVE NEGATIVE   Ketones, ur NEGATIVE NEGATIVE mg/dL   Protein, ur NEGATIVE NEGATIVE mg/dL   Nitrite NEGATIVE NEGATIVE   Leukocytes, UA NEGATIVE NEGATIVE   RBC / HPF 0-5 0 - 5 RBC/hpf   WBC, UA 0-5 0 - 5 WBC/hpf   Bacteria, UA NONE SEEN NONE SEEN    Comment: Performed at [facility], [address], [place], [place] [address]  Lipid panel     Status: Abnormal   Collection Time: 03/18/18  6:22 AM  Result Value Ref Range   Cholesterol 127 0 - 200 mg/dL   Triglycerides 34 <098 mg/dL   HDL 34 (L) >11 mg/dL   Total CHOL/HDL Ratio 3.7 RATIO   VLDL 7 0 - 40 mg/dL   LDL Cholesterol 86 0 - 99 mg/dL    Comment:        Total Cholesterol/HDL:CHD Risk Coronary Heart Disease Risk Table                     Men   Women  1/2 Average Risk   3.4   3.3  Average Risk       5.0   4.4  2 X Average Risk   9.6   7.1  3 X Average Risk  23.4   11.0        Use the calculated Patient Ratio above and the CHD Risk Table to determine the patient's CHD Risk.        ATP III CLASSIFICATION (LDL):  <100     mg/dL   Optimal  914-782  mg/dL   Near or Above                    Optimal  130-159  mg/dL   Borderline  956-213  mg/dL   High  >086     mg/dL   Very High Performed at [facility], [address], [place], [place] [address]    Ct Angio Head W Or Wo Contrast  Result Date: 03/18/2018 CLINICAL DATA:  Initial evaluation for acute headache, blurry vision. Found to have acute right PCA territory stroke. EXAM: CT ANGIOGRAPHY HEAD AND NECK TECHNIQUE: Multidetector CT imaging of the head and neck was performed using the standard protocol during bolus administration of intravenous contrast. Multiplanar CT image reconstructions and MIPs were obtained to evaluate the vascular anatomy. Carotid stenosis measurements (when applicable) are obtained utilizing NASCET criteria, using the distal internal carotid  diameter as the denominator. CONTRAST:  80mL ISOVUE-370 IOPAMIDOL (ISOVUE-370) INJECTION 76% COMPARISON:  Prior CT and MRI from earlier the same day. FINDINGS: CTA NECK FINDINGS Aortic arch: Visualized aortic arch of normal caliber with normal branch pattern. Mild atheromatous plaque within the arch itself. No flow-limiting stenosis about the origin of the great vessels. Visualized subclavian arteries widely patent. Right carotid system: Right common and internal carotid arteries widely patent without stenosis, dissection, or occlusion. Left carotid system: Left common and internal carotid arteries widely patent without stenosis, dissection, or occlusion. Vertebral  arteries: Both of the vertebral arteries arise from the subclavian arteries. Proximal right vertebral artery not well visualized due to adjacent venous contamination. Vertebral arteries otherwise widely patent within the neck without stenosis, dissection, or occlusion. Skeleton: No acute osseous abnormality. No discrete lytic or blastic osseous lesions. Scattered dental caries noted. Other neck: No other acute soft tissue abnormality within the neck. Upper chest: Visualized upper chest demonstrates no acute finding. Paraseptal and centrilobular emphysematous changes noted. Review of the MIP images confirms the above findings CTA HEAD FINDINGS Anterior circulation: Internal carotid arteries widely patent to the termini without stenosis. A1 segments, anterior communicating artery common anterior cerebral arteries widely patent. M1 segments widely patent bilaterally. Distal MCA branches well perfused and symmetric. Posterior circulation: Vertebral arteries widely patent to the vertebrobasilar junction without stenosis. Right PICA patent. Left PICA not well seen. Basilar widely patent to its distal aspect without stenosis. Superior cerebral arteries patent bilaterally. Posterior cerebral arteries primarily supplied via the basilar, although small bilateral  posterior communicating arteries noted. PCAs are patent to their distal aspects without proximal occlusion or stenosis. Venous sinuses: Patent. Anatomic variants: None significant. Delayed phase: Evolving right PCA territory infarct. No other abnormal enhancement. Review of the MIP images confirms the above findings IMPRESSION: 1. Negative CTA of the head and neck. No large vessel occlusion. No hemodynamically significant stenosis or other acute vascular abnormality. 2. Emphysema. Electronically Signed   By: Rise MuBenjamin  McClintock M.D.   On: 03/18/2018 00:00   Ct Head Wo Contrast  Result Date: 03/17/2018 CLINICAL DATA:  Headaches and bilateral blurred vision for 4 days. EXAM: CT HEAD WITHOUT CONTRAST TECHNIQUE: Contiguous axial images were obtained from the base of the skull through the vertex without intravenous contrast. COMPARISON:  None. FINDINGS: Brain: Focal poorly defined low-attenuation area with associated sulcal effacement in the right medial occipital lobe consistent with acute infarct in the distribution of the right posterior cerebral artery. Abscess or tumor would be a less likely consideration. MRI correlation is suggested. No acute intracranial hemorrhage. Otherwise, ventricles and sulci are symmetrical. No abnormal extra-axial fluid collections. Gray-white matter junctions are distinct. Basal cisterns are not effaced. No ventricular dilatation. Vascular: No hyperdense vessel or unexpected calcification. Skull: Calvarium appears intact. Sinuses/Orbits: Paranasal sinuses and mastoid air cells are clear. Other: None. IMPRESSION: Acute infarct in the distribution of the right posterior cerebral artery. No acute intracranial hemorrhage. Electronically Signed   By: Burman NievesWilliam  Stevens M.D.   On: 03/17/2018 20:25   Ct Angio Neck W And/or Wo Contrast  Result Date: 03/18/2018 CLINICAL DATA:  Initial evaluation for acute headache, blurry vision. Found to have acute right PCA territory stroke. EXAM: CT  ANGIOGRAPHY HEAD AND NECK TECHNIQUE: Multidetector CT imaging of the head and neck was performed using the standard protocol during bolus administration of intravenous contrast. Multiplanar CT image reconstructions and MIPs were obtained to evaluate the vascular anatomy. Carotid stenosis measurements (when applicable) are obtained utilizing NASCET criteria, using the distal internal carotid diameter as the denominator. CONTRAST:  80mL ISOVUE-370 IOPAMIDOL (ISOVUE-370) INJECTION 76% COMPARISON:  Prior CT and MRI from earlier the same day. FINDINGS: CTA NECK FINDINGS Aortic arch: Visualized aortic arch of normal caliber with normal branch pattern. Mild atheromatous plaque within the arch itself. No flow-limiting stenosis about the origin of the great vessels. Visualized subclavian arteries widely patent. Right carotid system: Right common and internal carotid arteries widely patent without stenosis, dissection, or occlusion. Left carotid system: Left common and internal carotid arteries widely patent without stenosis, dissection,  or occlusion. Vertebral arteries: Both of the vertebral arteries arise from the subclavian arteries. Proximal right vertebral artery not well visualized due to adjacent venous contamination. Vertebral arteries otherwise widely patent within the neck without stenosis, dissection, or occlusion. Skeleton: No acute osseous abnormality. No discrete lytic or blastic osseous lesions. Scattered dental caries noted. Other neck: No other acute soft tissue abnormality within the neck. Upper chest: Visualized upper chest demonstrates no acute finding. Paraseptal and centrilobular emphysematous changes noted. Review of the MIP images confirms the above findings CTA HEAD FINDINGS Anterior circulation: Internal carotid arteries widely patent to the termini without stenosis. A1 segments, anterior communicating artery common anterior cerebral arteries widely patent. M1 segments widely patent bilaterally.  Distal MCA branches well perfused and symmetric. Posterior circulation: Vertebral arteries widely patent to the vertebrobasilar junction without stenosis. Right PICA patent. Left PICA not well seen. Basilar widely patent to its distal aspect without stenosis. Superior cerebral arteries patent bilaterally. Posterior cerebral arteries primarily supplied via the basilar, although small bilateral posterior communicating arteries noted. PCAs are patent to their distal aspects without proximal occlusion or stenosis. Venous sinuses: Patent. Anatomic variants: None significant. Delayed phase: Evolving right PCA territory infarct. No other abnormal enhancement. Review of the MIP images confirms the above findings IMPRESSION: 1. Negative CTA of the head and neck. No large vessel occlusion. No hemodynamically significant stenosis or other acute vascular abnormality. 2. Emphysema. Electronically Signed   By: Rise Mu M.D.   On: 03/18/2018 00:00   Mr Brain Wo Contrast  Result Date: 03/17/2018 CLINICAL DATA:  Follow-up examination for acute stroke. EXAM: MRI HEAD WITHOUT CONTRAST TECHNIQUE: Multiplanar, multiecho pulse sequences of the brain and surrounding structures were obtained without intravenous contrast. COMPARISON:  Prior head CT from earlier same day. FINDINGS: Brain: Confluent diffusion abnormality involving the parasagittal cortical gray matter of the right temporal occipital region compatible with evolving right PCA territory infarct, largely acute in appearance. Localized gyral edema without significant regional mass effect. No associated hemorrhage. No other evidence for acute or subacute ischemia. Underlying cerebral volume is normal. No significant cerebral white matter changes. No mass lesion, midline shift or mass effect. No hydrocephalus. No extra-axial fluid collection. Pituitary gland suprasellar region normal. Midline structures intact and normal. Vascular: Major intracranial vascular flow  voids are maintained. Skull and upper cervical spine: Craniocervical junction normal. Upper cervical spine within normal limits. Bone marrow signal intensity normal. No scalp soft tissue abnormality. Sinuses/Orbits: Globes and orbital soft tissues within normal limits. Paranasal sinuses are clear. No mastoid effusion. Inner ear structures normal. Other: None. IMPRESSION: 1. 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 No associated hemorrhage or significant regional mass effect. 2. Otherwise normal brain MRI. Electronically Signed   By: Rise Mu M.D.   On: 03/17/2018 22:30    Pending Labs Unresulted Labs (From admission, onward)    Start     Ordered   03/18/18 0500  Hemoglobin A1c  Tomorrow morning,   R     03/17/18 2122   03/17/18 2123  HIV antibody (Routine Testing)  Once,   R     03/17/18 2122          Vitals/Pain Today's Vitals   03/18/18 0251 03/18/18 0511 03/18/18 0712 03/18/18 0727  BP: 128/67 123/73  (!) 140/110  Pulse: 68 73 65 76  Resp: 14 15 14  (!) 24  Temp:      TempSrc:      SpO2: 100% 93% 100% 100%  PainSc:  Isolation Precautions No active isolations  Medications Medications  acetaminophen (TYLENOL) tablet 650 mg (has no administration in time range)    Or  acetaminophen (TYLENOL) solution 650 mg (has no administration in time range)    Or  acetaminophen (TYLENOL) suppository 650 mg (has no administration in time range)  senna-docusate (Senokot-S) tablet 1 tablet (has no administration in time range)  aspirin suppository 300 mg ( Rectal See Alternative 03/17/18 2232)    Or  aspirin tablet 325 mg (325 mg Oral Given 03/17/18 2232)  sodium chloride (PF) 0.9 % injection (has no administration in time range)  iopamidol (ISOVUE-370) 76 % injection (has no administration in time range)  sodium chloride 0.9 % bolus 1,000 mL (0 mLs Intravenous Stopped 03/17/18 2040)  prochlorperazine (COMPAZINE) injection 10 mg (10 mg Intravenous Given 03/17/18 1931)   diphenhydrAMINE (BENADRYL) injection 25 mg (25 mg Intravenous Given 03/17/18 1930)  dexamethasone (DECADRON) injection 10 mg (10 mg Intravenous Given 03/17/18 1932)   stroke: mapping our early stages of recovery book ( Does not apply Given 03/17/18 2233)  iopamidol (ISOVUE-370) 76 % injection 100 mL (80 mLs Intravenous Contrast Given 03/17/18 2242)    Mobility walks

## 2018-03-18 NOTE — ED Notes (Signed)
Pt ambulated to the bathroom without any assistance. Steady gait.

## 2018-03-18 NOTE — Progress Notes (Signed)
    [facility] has been requested to perform a transesophageal echocardiogram on 02/06 at 1:00 pm with Dr Jens Som for stroke.  After careful review of history and examination, the risks and benefits of transesophageal echocardiogram have been explained including risks of esophageal damage, perforation (1:10,000 risk), bleeding, pharyngeal hematoma as well as other potential complications associated with conscious sedation including aspiration, arrhythmia, respiratory failure and death. Alternatives to treatment were discussed, questions were answered. Patient is willing to proceed.   Theodore Demark, PA-C 03/18/2018 2:56 PM

## 2018-03-18 NOTE — Consult Note (Signed)
Stroke Neurology Consultation Note  Consult Requested by: Dr. Emokpae  Reason for Consult: stroke  Consult Date: 03/18/18  The history was obtained from the pt.  During history and examination, all items were able to obtain unless otherwise noted.  History of Present Illness:  Hector Wilcox is a 42 y.o. African American male with PMH of GERD, smoker, alcohol user, migraine admitted for HA and blurry vision.  Patient stated that Friday night he had drunk alcohol, smoked and eaten pizza. Saturday morning he woke up with severe b/l frontal HA and gray color vision for 10-15 secs followed with blurry vision bilaterally. Over the next a couple of days, HA gradually improving and location moved from frontal to the back of head, and blurry vision became more slow motion and delay images b/l but more on the left. He went to [facility] ED and CT showed right PCA infarct. He also had MRI confirmed right PCA stroke and CTA head and neck unremarkable. He was transferred to [facility] for further evaluation.  He denies any weakness, numbness, speech difficulty or seizure-like activity.  He denies any heart disease, only complains of GERD, especially at night.  He has been smoking, and light drinking and denies illicit drug use.  His father and brother has a sickle cell disease, and he was told to have sickle cell trait.  LSN: 03/13/18 night tPA Given: No: out of window, established stroke  Past Medical History:  Diagnosis Date  . Acid reflux   . Sickle cell trait (HCC)     History reviewed. No pertinent surgical history.  History reviewed. No pertinent family history.  Social History:  reports that he has been smoking cigars. He has a 3.25 pack-year smoking history. He has never used smokeless tobacco. He reports current alcohol use of about 4.0 standard drinks of alcohol per week. He reports that he does not use drugs.  Allergies:  Allergies  Allergen Reactions  . Tramadol Swelling    lips    No current  facility-administered medications on file prior to encounter.    Current Outpatient Medications on File Prior to Encounter  Medication Sig Dispense Refill  . alum & mag hydroxide-simeth (MAALOX PLUS) 400-400-40 MG/5ML suspension Take 15 mLs by mouth as needed for indigestion.    . Cal Carb-Mag Hydrox-Simeth (ROLAIDS ADVANCED PO) Take 3 tablets by mouth as needed (heartburn).    . cetirizine (ZYRTEC) 10 MG tablet Take 10 mg by mouth daily as needed for allergies.    . hydroxypropyl methylcellulose / hypromellose (ISOPTO TEARS / GONIOVISC) 2.5 % ophthalmic solution Place 1 drop into both eyes as needed for dry eyes.    . bismuth subsalicylate (PEPTO-BISMOL) 262 MG/15ML suspension Take 30 mLs by mouth 4 (four) times daily -  before meals and at bedtime. (Patient not taking: Reported on 03/17/2018) 1700 mL 0  . famotidine (PEPCID) 20 MG tablet Take 1 tablet (20 mg total) by mouth 2 (two) times daily. (Patient not taking: Reported on 03/17/2018) 30 tablet 0  . fluticasone (FLONASE) 50 MCG/ACT nasal spray Place 2 sprays into both nostrils daily. (Patient not taking: Reported on 07/10/2017) 1 g 0  . pantoprazole (PROTONIX) 40 MG tablet Take 1 tablet (40 mg total) by mouth daily. (Patient not taking: Reported on 03/17/2018) 30 tablet 0  . sucralfate (CARAFATE) 1 g tablet Take 1 tablet (1 g total) by mouth 4 (four) times daily -  with meals and at bedtime. (Patient not taking: Reported on 03/17/2018) 30 tablet 0      Review of Systems: A full ROS was attempted today and was able to be performed.  Systems assessed include - Constitutional, Eyes, HENT, Respiratory, Cardiovascular, Gastrointestinal, Genitourinary, Integument/breast, Hematologic/lymphatic, Musculoskeletal, Neurological, Behavioral/Psych, Endocrine, Allergic/Immunologic - with pertinent responses as per HPI.  Physical Examination: Temp:  [98 F (36.7 C)-98.2 F (36.8 C)] 98 F (36.7 C) (02/05 0840) Pulse Rate:  [55-96] 55 (02/05 0840) Resp:  [14-24]  20 (02/05 0840) BP: (115-161)/(64-110) 115/64 (02/05 0840) SpO2:  [93 %-100 %] 98 % (02/05 0840)  General - well nourished, well developed, in no apparent distress.    Ophthalmologic - fundi not visualized due to noncooperation.    Cardiovascular - regular rate and rhythm  Mental Status -  Level of arousal and orientation to time, place, and person were intact. Language including expression, naming, repetition, comprehension, reading, and writing was assessed and found intact. Attention span and concentration were normal. Fund of Knowledge was assessed and was intact.  Cranial Nerves II - XII - II - visual field testing showed left upper quadrantanopsia. III, IV, VI - Extraocular movements intact. V - Facial sensation intact bilaterally. VII - Facial movement intact bilaterally. VIII - Hearing & vestibular intact bilaterally. X - Palate elevates symmetrically. XI - Chin turning & shoulder shrug intact bilaterally. XII - Tongue protrusion intact.  Motor Strength - The patient's strength was normal in all extremities and pronator drift was absent.   Motor Tone & Bulk - Muscle tone was assessed at the neck and appendages and was normal.  Bulk was normal and fasciculations were absent.   Reflexes - The patient's reflexes were normal in all extremities and he had no pathological reflexes.  Sensory - Light touch, temperature/pinprick were assessed and were normal.    Coordination - The patient had normal movements in the hands and feet with no ataxia or dysmetria.  Tremor was absent.  Gait and Station - deferred  Data Reviewed: Ct Angio Head W Or Wo Contrast  Result Date: 03/18/2018 CLINICAL DATA:  Initial evaluation for acute headache, blurry vision. Found to have acute right PCA territory stroke. EXAM: CT ANGIOGRAPHY HEAD AND NECK TECHNIQUE: Multidetector CT imaging of the head and neck was performed using the standard protocol during bolus administration of intravenous contrast.  Multiplanar CT image reconstructions and MIPs were obtained to evaluate the vascular anatomy. Carotid stenosis measurements (when applicable) are obtained utilizing NASCET criteria, using the distal internal carotid diameter as the denominator. CONTRAST:  80mL ISOVUE-370 IOPAMIDOL (ISOVUE-370) INJECTION 76% COMPARISON:  Prior CT and MRI from earlier the same day. FINDINGS: CTA NECK FINDINGS Aortic arch: Visualized aortic arch of normal caliber with normal branch pattern. Mild atheromatous plaque within the arch itself. No flow-limiting stenosis about the origin of the great vessels. Visualized subclavian arteries widely patent. Right carotid system: Right common and internal carotid arteries widely patent without stenosis, dissection, or occlusion. Left carotid system: Left common and internal carotid arteries widely patent without stenosis, dissection, or occlusion. Vertebral arteries: Both of the vertebral arteries arise from the subclavian arteries. Proximal right vertebral artery not well visualized due to adjacent venous contamination. Vertebral arteries otherwise widely patent within the neck without stenosis, dissection, or occlusion. Skeleton: No acute osseous abnormality. No discrete lytic or blastic osseous lesions. Scattered dental caries noted. Other neck: No other acute soft tissue abnormality within the neck. Upper chest: Visualized upper chest demonstrates no acute finding. Paraseptal and centrilobular emphysematous changes noted. Review of the MIP images confirms the above findings CTA HEAD FINDINGS Anterior  circulation: Internal carotid arteries widely patent to the termini without stenosis. A1 segments, anterior communicating artery common anterior cerebral arteries widely patent. M1 segments widely patent bilaterally. Distal MCA branches well perfused and symmetric. Posterior circulation: Vertebral arteries widely patent to the vertebrobasilar junction without stenosis. Right PICA patent. Left  PICA not well seen. Basilar widely patent to its distal aspect without stenosis. Superior cerebral arteries patent bilaterally. Posterior cerebral arteries primarily supplied via the basilar, although small bilateral posterior communicating arteries noted. PCAs are patent to their distal aspects without proximal occlusion or stenosis. Venous sinuses: Patent. Anatomic variants: None significant. Delayed phase: Evolving right PCA territory infarct. No other abnormal enhancement. Review of the MIP images confirms the above findings IMPRESSION: 1. Negative CTA of the head and neck. No large vessel occlusion. No hemodynamically significant stenosis or other acute vascular abnormality. 2. Emphysema. Electronically Signed   By: Rise Mu M.D.   On: 03/18/2018 00:00   Ct Head Wo Contrast  Result Date: 03/17/2018 CLINICAL DATA:  Headaches and bilateral blurred vision for 4 days. EXAM: CT HEAD WITHOUT CONTRAST TECHNIQUE: Contiguous axial images were obtained from the base of the skull through the vertex without intravenous contrast. COMPARISON:  None. FINDINGS: Brain: Focal poorly defined low-attenuation area with associated sulcal effacement in the right medial occipital lobe consistent with acute infarct in the distribution of the right posterior cerebral artery. Abscess or tumor would be a less likely consideration. MRI correlation is suggested. No acute intracranial hemorrhage. Otherwise, ventricles and sulci are symmetrical. No abnormal extra-axial fluid collections. Gray-white matter junctions are distinct. Basal cisterns are not effaced. No ventricular dilatation. Vascular: No hyperdense vessel or unexpected calcification. Skull: Calvarium appears intact. Sinuses/Orbits: Paranasal sinuses and mastoid air cells are clear. Other: None. IMPRESSION: Acute infarct in the distribution of the right posterior cerebral artery. No acute intracranial hemorrhage. Electronically Signed   By: Burman Nieves M.D.    On: 03/17/2018 20:25   Ct Angio Neck W And/or Wo Contrast  Result Date: 03/18/2018 CLINICAL DATA:  Initial evaluation for acute headache, blurry vision. Found to have acute right PCA territory stroke. EXAM: CT ANGIOGRAPHY HEAD AND NECK TECHNIQUE: Multidetector CT imaging of the head and neck was performed using the standard protocol during bolus administration of intravenous contrast. Multiplanar CT image reconstructions and MIPs were obtained to evaluate the vascular anatomy. Carotid stenosis measurements (when applicable) are obtained utilizing NASCET criteria, using the distal internal carotid diameter as the denominator. CONTRAST:  80mL ISOVUE-370 IOPAMIDOL (ISOVUE-370) INJECTION 76% COMPARISON:  Prior CT and MRI from earlier the same day. FINDINGS: CTA NECK FINDINGS Aortic arch: Visualized aortic arch of normal caliber with normal branch pattern. Mild atheromatous plaque within the arch itself. No flow-limiting stenosis about the origin of the great vessels. Visualized subclavian arteries widely patent. Right carotid system: Right common and internal carotid arteries widely patent without stenosis, dissection, or occlusion. Left carotid system: Left common and internal carotid arteries widely patent without stenosis, dissection, or occlusion. Vertebral arteries: Both of the vertebral arteries arise from the subclavian arteries. Proximal right vertebral artery not well visualized due to adjacent venous contamination. Vertebral arteries otherwise widely patent within the neck without stenosis, dissection, or occlusion. Skeleton: No acute osseous abnormality. No discrete lytic or blastic osseous lesions. Scattered dental caries noted. Other neck: No other acute soft tissue abnormality within the neck. Upper chest: Visualized upper chest demonstrates no acute finding. Paraseptal and centrilobular emphysematous changes noted. Review of the MIP images confirms the above findings CTA  HEAD FINDINGS Anterior  circulation: Internal carotid arteries widely patent to the termini without stenosis. A1 segments, anterior communicating artery common anterior cerebral arteries widely patent. M1 segments widely patent bilaterally. Distal MCA branches well perfused and symmetric. Posterior circulation: Vertebral arteries widely patent to the vertebrobasilar junction without stenosis. Right PICA patent. Left PICA not well seen. Basilar widely patent to its distal aspect without stenosis. Superior cerebral arteries patent bilaterally. Posterior cerebral arteries primarily supplied via the basilar, although small bilateral posterior communicating arteries noted. PCAs are patent to their distal aspects without proximal occlusion or stenosis. Venous sinuses: Patent. Anatomic variants: None significant. Delayed phase: Evolving right PCA territory infarct. No other abnormal enhancement. Review of the MIP images confirms the above findings IMPRESSION: 1. Negative CTA of the head and neck. No large vessel occlusion. No hemodynamically significant stenosis or other acute vascular abnormality. 2. Emphysema. Electronically Signed   By: Rise Mu M.D.   On: 03/18/2018 00:00   Mr Brain Wo Contrast  Result Date: 03/17/2018 CLINICAL DATA:  Follow-up examination for acute stroke. EXAM: MRI HEAD WITHOUT CONTRAST TECHNIQUE: Multiplanar, multiecho pulse sequences of the brain and surrounding structures were obtained without intravenous contrast. COMPARISON:  Prior head CT from earlier same day. FINDINGS: Brain: Confluent diffusion abnormality involving the parasagittal cortical gray matter of the right temporal occipital region compatible with evolving right PCA territory infarct, largely acute in appearance. Localized gyral edema without significant regional mass effect. No associated hemorrhage. No other evidence for acute or subacute ischemia. Underlying cerebral volume is normal. No significant cerebral white matter changes. No mass  lesion, midline shift or mass effect. No hydrocephalus. No extra-axial fluid collection. Pituitary gland suprasellar region normal. Midline structures intact and normal. Vascular: Major intracranial vascular flow voids are maintained. Skull and upper cervical spine: Craniocervical junction normal. Upper cervical spine within normal limits. Bone marrow signal intensity normal. No scalp soft tissue abnormality. Sinuses/Orbits: Globes and orbital soft tissues within normal limits. Paranasal sinuses are clear. No mastoid effusion. Inner ear structures normal. Other: None. IMPRESSION: 1. 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 No associated hemorrhage or significant regional mass effect. 2. Otherwise normal brain MRI. Electronically Signed   By: Rise Mu M.D.   On: 03/17/2018 22:30    Assessment: 43 y.o. male with PMH of GERD, smoker, alcohol user, migraine admitted for HA and blurry vision.  Exam showed left upper quadrantanopsia. CT showed right PCA infarct, which confirmed on MRI. CTA head and neck unremarkable.  2D echo pending.  LDL 86 and A1c 5.9.  UDS negative.  Although he has stroke risk factor of smoking, alcohol, migraine, sickle cell trait, for his young age with unremarkable vasculature, further cardioembolic work-up is warranted.  Stroke Risk Factors - smoking and alcohol, migraine, sickle cell trait  Plan: -2D echo pending -LE venous Doppler pending to rule out DVT -TEE pending to rule out PFO or other cardioembolic source (NPO after midnight).  Did not order loop given his young age. -Hypercoagulable work-up pending - Prophylactic therapy-Antiplatelet med: Aspirin - dose 325 -On statin - Risk factor modification -Quit smoking and limit alcohol use - Telemetry monitoring - Frequent neuro checks -Stroke team to follow in a.m.  Thank you for this consultation and allowing [place] to participate in the care of this patient.  Marvel Plan, MD PhD Stroke  Neurology 03/18/2018 11:08 AM

## 2018-03-18 NOTE — Progress Notes (Addendum)
  Patient seen and evaluated, chart reviewed, please see EMR for updated orders. Please see full H&P dictated by attending physician Dr. Susie Cassette for same date of service.    Discussed with neurologist Dr. Roda Shutters...., Patient is scheduled for TEE on 03/19/2018   Shon Hale, MD

## 2018-03-18 NOTE — Progress Notes (Signed)
PT Cancellation Note  Patient Details Name: Hector Wilcox MRN: [id number] DOB: April 04, 1975   Cancelled Treatment:    Reason Eval/Treat Not Completed: Other (comment)Transfer to [facility] cone. Hector Wilcox PT Acute Rehabilitation Services Pager [phone] Office [phone]   Rada Hay 03/18/2018, 8:13 AM

## 2018-03-19 ENCOUNTER — Encounter (HOSPITAL_COMMUNITY)
Admission: EM | Disposition: A | Discharge status: Home / Self Care | Payer: Self-pay | Source: Home / Self Care | Attending: Family Medicine

## 2018-03-19 ENCOUNTER — Inpatient Hospital Stay (HOSPITAL_COMMUNITY): Payer: Self-pay

## 2018-03-19 ENCOUNTER — Encounter (HOSPITAL_COMMUNITY): Payer: Self-pay | Admitting: Cardiology

## 2018-03-19 ENCOUNTER — Inpatient Hospital Stay (HOSPITAL_COMMUNITY): Payer: Self-pay | Admitting: Critical Care Medicine

## 2018-03-19 DIAGNOSIS — I6389 Other cerebral infarction: Secondary | ICD-10-CM

## 2018-03-19 HISTORY — PX: TEE WITHOUT CARDIOVERSION: SHX5443

## 2018-03-19 LAB — COMPREHENSIVE METABOLIC PANEL
ALK PHOS: 59 U/L (ref 38–126)
ALT: 27 U/L (ref 0–44)
AST: 23 U/L (ref 15–41)
Albumin: 3.6 g/dL (ref 3.5–5.0)
Anion gap: 11 (ref 5–15)
BUN: 11 mg/dL (ref 6–20)
CO2: 24 mmol/L (ref 22–32)
Calcium: 8.8 mg/dL — ABNORMAL LOW (ref 8.9–10.3)
Chloride: 104 mmol/L (ref 98–111)
Creatinine, Ser: 0.9 mg/dL (ref 0.61–1.24)
GFR calc Af Amer: >60 mL/min (ref >60)
GFR calc non Af Amer: >60 mL/min (ref >60)
Glucose, Bld: 109 mg/dL — ABNORMAL HIGH (ref 70–99)
Potassium: 4.1 mmol/L (ref 3.5–5.1)
Sodium: 139 mmol/L (ref 135–145)
Total Bilirubin: 0.5 mg/dL (ref 0.3–1.2)
Total Protein: 6.1 g/dL — ABNORMAL LOW (ref 6.5–8.1)

## 2018-03-19 LAB — TSH: TSH: 0.895 u[IU]/mL (ref 0.350–4.500)

## 2018-03-19 LAB — VITAMIN B12: Vitamin B-12: 320 pg/mL (ref 180–914)

## 2018-03-19 LAB — C-REACTIVE PROTEIN: CRP: <0.8 mg/dL (ref <1.0)

## 2018-03-19 LAB — SEDIMENTATION RATE: Sed Rate: 0 mm/hr (ref 0–16)

## 2018-03-19 SURGERY — ECHOCARDIOGRAM, TRANSESOPHAGEAL
Anesthesia: Monitor Anesthesia Care

## 2018-03-19 MED ORDER — ASPIRIN 81 MG PO CHEW
81.0000 mg | CHEWABLE_TABLET | Freq: Every day | ORAL | Status: DC
  Administered 2018-03-19 – 2018-03-20 (×2): 81 mg via ORAL
  Filled 2018-03-19 (×2): qty 1

## 2018-03-19 MED ORDER — PANTOPRAZOLE SODIUM 40 MG PO TBEC
40.0000 mg | DELAYED_RELEASE_TABLET | Freq: Every day | ORAL | Status: DC
  Administered 2018-03-19 – 2018-03-20 (×2): 40 mg via ORAL
  Filled 2018-03-19 (×2): qty 1

## 2018-03-19 MED ORDER — DIPHENHYDRAMINE HCL 50 MG/ML IJ SOLN
INTRAMUSCULAR | Status: AC
  Filled 2018-03-19: qty 1

## 2018-03-19 MED ORDER — ONDANSETRON HCL 4 MG/2ML IJ SOLN
INTRAMUSCULAR | Status: DC | PRN
  Administered 2018-03-19: 4 mg via INTRAVENOUS

## 2018-03-19 MED ORDER — FENTANYL CITRATE (PF) 100 MCG/2ML IJ SOLN
INTRAMUSCULAR | Status: AC
  Filled 2018-03-19: qty 4

## 2018-03-19 MED ORDER — SUCRALFATE 1 G PO TABS
1.0000 g | ORAL_TABLET | Freq: Three times a day (TID) | ORAL | Status: DC
  Administered 2018-03-19 – 2018-03-20 (×4): 1 g via ORAL
  Filled 2018-03-19 (×3): qty 1

## 2018-03-19 MED ORDER — SODIUM CHLORIDE BACTERIOSTATIC 0.9 % IJ SOLN
INTRAMUSCULAR | Status: DC | PRN
  Administered 2018-03-19: 9 mL via INTRAVENOUS

## 2018-03-19 MED ORDER — NICOTINE 14 MG/24HR TD PT24
14.0000 mg | MEDICATED_PATCH | Freq: Every day | TRANSDERMAL | Status: DC
  Administered 2018-03-20: 14 mg via TRANSDERMAL
  Filled 2018-03-19: qty 1

## 2018-03-19 MED ORDER — CLOPIDOGREL BISULFATE 75 MG PO TABS
75.0000 mg | ORAL_TABLET | Freq: Every day | ORAL | Status: DC
  Administered 2018-03-19 – 2018-03-20 (×2): 75 mg via ORAL
  Filled 2018-03-19 (×2): qty 1

## 2018-03-19 MED ORDER — PROPOFOL 10 MG/ML IV BOLUS
INTRAVENOUS | Status: DC | PRN
  Administered 2018-03-19 (×3): 20 mg via INTRAVENOUS

## 2018-03-19 MED ORDER — MIDAZOLAM HCL (PF) 5 MG/ML IJ SOLN
INTRAMUSCULAR | Status: AC
  Filled 2018-03-19: qty 3

## 2018-03-19 MED ORDER — PROPOFOL 500 MG/50ML IV EMUL
INTRAVENOUS | Status: DC | PRN
  Administered 2018-03-19: 200 ug/kg/min via INTRAVENOUS

## 2018-03-19 NOTE — Interval H&P Note (Signed)
History and Physical Interval Note:  03/19/2018 10:27 AM  Hector Wilcox  has presented today for surgery, with the diagnosis of STROKE  The various methods of treatment have been discussed with the patient and family. After consideration of risks, benefits and other options for treatment, the patient has consented to  Procedure(s): TRANSESOPHAGEAL ECHOCARDIOGRAM (TEE) (N/A) as a surgical intervention .  The patient's history has been reviewed, patient examined, no change in status, stable for surgery.  I have reviewed the patient's chart and labs.  Questions were answered to the patient's satisfaction.     [name]

## 2018-03-19 NOTE — Anesthesia Postprocedure Evaluation (Signed)
Anesthesia Post Note  Patient: Hector Wilcox  Procedure(s) Performed: TRANSESOPHAGEAL ECHOCARDIOGRAM (TEE) (N/A ) BUBBLE STUDY     Patient location during evaluation: PACU Anesthesia Type: MAC Level of consciousness: awake and alert and oriented Pain management: pain level controlled Vital Signs Assessment: post-procedure vital signs reviewed and stable Respiratory status: spontaneous breathing, nonlabored ventilation and respiratory function stable Cardiovascular status: stable and blood pressure returned to baseline Postop Assessment: no apparent nausea or vomiting Anesthetic complications: no    Last Vitals:  Vitals:   03/19/18 1125 03/19/18 1130  BP: 127/90   Pulse: 98 92  Resp: 15 15  Temp: 36.8 C   SpO2: 98% 97%    Last Pain:  Vitals:   03/19/18 1130  TempSrc:   PainSc: 0-No pain                 Nardos Putnam A.

## 2018-03-19 NOTE — Progress Notes (Signed)
Hector Wilcox Demographics:    Hector Wilcox, is a 43 y.o. male, DOB - 04/09/1975, ONG:[id number]  Admit date - 03/17/2018   Admitting Physician Richarda OverlieNayana Abrol, MD  Outpatient Primary MD for the Hector Wilcox is Hector Wilcox, No Pcp Per  LOS - 1   Chief Complaint  Hector Wilcox presents with  . Headache        Subjective:    Hector AdeMario Bergen today has no fevers, no emesis,  No chest pain,  Visual disturbance persist  Assessment  & Plan :    Principal Problem:   Stroke [facility](HCC)  Brief Summary [id number] Old male with past medical history relevant for migraine headaches, tobacco abuse, and GERD, sickle cell trait and alcohol abuse admitted 03/17/18 with acute Rt PCA stroke findings and left eye visual loss... MRI brain confirms right PCA stroke, CTA head and CTA neck without acute findings, [facility] for further neuro evaluation and TEE   Plan:- 1)Acute Rt PCA CVA----   left eye visual disturbance persist, TEE on 03/18/18 shows Normal LV function; no LAA thrombus; negative saline microcavitation study--- discussed with Dr. Pearlean BrownieSethi from stroke neurology service, treat empirically with aspirin and Plavix for 3 weeks and then after 3 weeks may drop the Plavix and treat with just aspirin after that, Lipitor 40 mg daily, LDL is 86, HDL is 34, A1c is 5.9, TSH is 0.8, UDS negative.  Hypercoagulability work-up is pending  2)Tobacco Abuse--- smoking cessation strongly advised may use nicotine patch  3)Alcohol Abuse----start vitamin, folic acid, lorazepam per CIWA protocol  4)Possible Sleep Apnea--- Dr. Pearlean BrownieSethi the stroke neurologist recommends Sleep smart study overnight  Disposition/Need for in-Hospital Stay-  Hector Wilcox unable to be discharged at this time due to need for sleep smart study overnight , further telemetry monitoring for possible arrhythmias given stroke in a young Hector Wilcox with no obvious etiology, and further neuro  checks--- neurologist advises against discharged tonight  Code Status : Full  Family Communication:    none   Disposition Plan  : home   Consults  :  Stroke Neurology  DVT Prophylaxis  :  Lovenox    Lab Results  Component Value Date   PLT 199 03/17/2018    Inpatient Medications  Scheduled Meds: . aspirin  81 mg Oral Daily  . atorvastatin  40 mg Oral q1800  . clopidogrel  75 mg Oral Daily  . enoxaparin (LOVENOX) injection  40 mg Subcutaneous Q24H  . folic acid  1 mg Oral Daily  . LORazepam  1 mg Oral QHS  . multivitamin with minerals  1 tablet Oral Daily  . [START ON 03/20/2018] nicotine  14 mg Transdermal Daily  . pantoprazole  40 mg Oral Daily  . sucralfate  1 g Oral TID WC & HS  . thiamine  100 mg Oral Daily   Or  . thiamine  100 mg Intravenous Daily   Continuous Infusions: PRN Meds:.acetaminophen **OR** acetaminophen (TYLENOL) oral liquid 160 mg/5 mL **OR** acetaminophen, LORazepam **OR** LORazepam, senna-docusate   Anti-infectives (From admission, onward)   None        Objective:   Vitals:   03/19/18 1130 03/19/18 1135 03/19/18 1140 03/19/18 1244  BP:  (!) 129/57  118/85  Pulse: 92 92  69  Resp: 15 17  16   Temp:    98 F (36.7 C)  TempSrc:    Oral  SpO2: 97% 100% 99% 100%  Weight:      Height:        Wt Readings from Last 3 Encounters:  03/18/18 60 kg  07/10/17 81.2 kg  09/27/16 81.6 kg     Intake/Output Summary (Last 24 hours) at 03/19/2018 1417 Last data filed at 03/19/2018 1122 Gross per 24 hour  Intake 1290 ml  Output -  Net 1290 ml    Physical Exam Hector Wilcox is examined daily including today on 03/19/18 , exams remain the same as of yesterday except that has changed   Gen:- Awake Alert,  In no apparent distress  HEENT:- [place].AT, No sclera icterus Eyes- visual field testing showed left upper quadrantanopsia Neck-Supple Neck,No JVD,.  Lungs-  CTAB , fair symmetrical air movement CV- S1, S2 normal, regular  Abd-  +ve B.Sounds, Abd Soft, No  tenderness,    Extremity/Skin:- No  edema, pedal pulses present  Psych-affect is appropriate, oriented x3 Neuro-left eye visual disturbance, gait is steady   Data Review:   Micro Results No results found for this or any previous visit (from the past 240 hour(s)).  Radiology Reports Ct Angio Head W Or Wo Contrast  Result Date: 03/18/2018 CLINICAL DATA:  Initial evaluation for acute headache, blurry vision. Found to have acute right PCA territory stroke. EXAM: CT ANGIOGRAPHY HEAD AND NECK TECHNIQUE: Multidetector CT imaging of the head and neck was performed using the standard protocol during bolus administration of intravenous contrast. Multiplanar CT image reconstructions and MIPs were obtained to evaluate the vascular anatomy. Carotid stenosis measurements (when applicable) are obtained utilizing NASCET criteria, using the distal internal carotid diameter as the denominator. CONTRAST:  80mL ISOVUE-370 IOPAMIDOL (ISOVUE-370) INJECTION 76% COMPARISON:  Prior CT and MRI from earlier the same day. FINDINGS: CTA NECK FINDINGS Aortic arch: Visualized aortic arch of normal caliber with normal branch pattern. Mild atheromatous plaque within the arch itself. No flow-limiting stenosis about the origin of the great vessels. Visualized subclavian arteries widely patent. Right carotid system: Right common and internal carotid arteries widely patent without stenosis, dissection, or occlusion. Left carotid system: Left common and internal carotid arteries widely patent without stenosis, dissection, or occlusion. Vertebral arteries: Both of the vertebral arteries arise from the subclavian arteries. Proximal right vertebral artery not well visualized due to adjacent venous contamination. Vertebral arteries otherwise widely patent within the neck without stenosis, dissection, or occlusion. Skeleton: No acute osseous abnormality. No discrete lytic or blastic osseous lesions. Scattered dental caries noted. Other neck: No  other acute soft tissue abnormality within the neck. Upper chest: Visualized upper chest demonstrates no acute finding. Paraseptal and centrilobular emphysematous changes noted. Review of the MIP images confirms the above findings CTA HEAD FINDINGS Anterior circulation: Internal carotid arteries widely patent to the termini without stenosis. A1 segments, anterior communicating artery common anterior cerebral arteries widely patent. M1 segments widely patent bilaterally. Distal MCA branches well perfused and symmetric. Posterior circulation: Vertebral arteries widely patent to the vertebrobasilar junction without stenosis. Right PICA patent. Left PICA not well seen. Basilar widely patent to its distal aspect without stenosis. Superior cerebral arteries patent bilaterally. Posterior cerebral arteries primarily supplied via the basilar, although small bilateral posterior communicating arteries noted. PCAs are patent to their distal aspects without proximal occlusion or stenosis. Venous sinuses: Patent. Anatomic variants: None significant. Delayed phase: Evolving right PCA territory infarct. No other abnormal enhancement. Review  of the MIP images confirms the above findings IMPRESSION: 1. Negative CTA of the head and neck. No large vessel occlusion. No hemodynamically significant stenosis or other acute vascular abnormality. 2. Emphysema. Electronically Signed   By: Rise Mu M.D.   On: 03/18/2018 00:00   Ct Head Wo Contrast  Result Date: 03/17/2018 CLINICAL DATA:  Headaches and bilateral blurred vision for 4 days. EXAM: CT HEAD WITHOUT CONTRAST TECHNIQUE: Contiguous axial images were obtained from the base of the skull through the vertex without intravenous contrast. COMPARISON:  None. FINDINGS: Brain: Focal poorly defined low-attenuation area with associated sulcal effacement in the right medial occipital lobe consistent with acute infarct in the distribution of the right posterior cerebral artery.  Abscess or tumor would be a less likely consideration. MRI correlation is suggested. No acute intracranial hemorrhage. Otherwise, ventricles and sulci are symmetrical. No abnormal extra-axial fluid collections. Gray-white matter junctions are distinct. Basal cisterns are not effaced. No ventricular dilatation. Vascular: No hyperdense vessel or unexpected calcification. Skull: Calvarium appears intact. Sinuses/Orbits: Paranasal sinuses and mastoid air cells are clear. Other: None. IMPRESSION: Acute infarct in the distribution of the right posterior cerebral artery. No acute intracranial hemorrhage. Electronically Signed   By: Burman Nieves M.D.   On: 03/17/2018 20:25   Ct Angio Neck W And/or Wo Contrast  Result Date: 03/18/2018 CLINICAL DATA:  Initial evaluation for acute headache, blurry vision. Found to have acute right PCA territory stroke. EXAM: CT ANGIOGRAPHY HEAD AND NECK TECHNIQUE: Multidetector CT imaging of the head and neck was performed using the standard protocol during bolus administration of intravenous contrast. Multiplanar CT image reconstructions and MIPs were obtained to evaluate the vascular anatomy. Carotid stenosis measurements (when applicable) are obtained utilizing NASCET criteria, using the distal internal carotid diameter as the denominator. CONTRAST:  20mL ISOVUE-370 IOPAMIDOL (ISOVUE-370) INJECTION 76% COMPARISON:  Prior CT and MRI from earlier the same day. FINDINGS: CTA NECK FINDINGS Aortic arch: Visualized aortic arch of normal caliber with normal branch pattern. Mild atheromatous plaque within the arch itself. No flow-limiting stenosis about the origin of the great vessels. Visualized subclavian arteries widely patent. Right carotid system: Right common and internal carotid arteries widely patent without stenosis, dissection, or occlusion. Left carotid system: Left common and internal carotid arteries widely patent without stenosis, dissection, or occlusion. Vertebral arteries:  Both of the vertebral arteries arise from the subclavian arteries. Proximal right vertebral artery not well visualized due to adjacent venous contamination. Vertebral arteries otherwise widely patent within the neck without stenosis, dissection, or occlusion. Skeleton: No acute osseous abnormality. No discrete lytic or blastic osseous lesions. Scattered dental caries noted. Other neck: No other acute soft tissue abnormality within the neck. Upper chest: Visualized upper chest demonstrates no acute finding. Paraseptal and centrilobular emphysematous changes noted. Review of the MIP images confirms the above findings CTA HEAD FINDINGS Anterior circulation: Internal carotid arteries widely patent to the termini without stenosis. A1 segments, anterior communicating artery common anterior cerebral arteries widely patent. M1 segments widely patent bilaterally. Distal MCA branches well perfused and symmetric. Posterior circulation: Vertebral arteries widely patent to the vertebrobasilar junction without stenosis. Right PICA patent. Left PICA not well seen. Basilar widely patent to its distal aspect without stenosis. Superior cerebral arteries patent bilaterally. Posterior cerebral arteries primarily supplied via the basilar, although small bilateral posterior communicating arteries noted. PCAs are patent to their distal aspects without proximal occlusion or stenosis. Venous sinuses: Patent. Anatomic variants: None significant. Delayed phase: Evolving right PCA territory infarct. No other  abnormal enhancement. Review of the MIP images confirms the above findings IMPRESSION: 1. Negative CTA of the head and neck. No large vessel occlusion. No hemodynamically significant stenosis or other acute vascular abnormality. 2. Emphysema. Electronically Signed   By: Rise Mu M.D.   On: 03/18/2018 00:00   Mr Brain Wo Contrast  Result Date: 03/17/2018 CLINICAL DATA:  Follow-up examination for acute stroke. EXAM: MRI HEAD  WITHOUT CONTRAST TECHNIQUE: Multiplanar, multiecho pulse sequences of the brain and surrounding structures were obtained without intravenous contrast. COMPARISON:  Prior head CT from earlier same day. FINDINGS: Brain: Confluent diffusion abnormality involving the parasagittal cortical gray matter of the right temporal occipital region compatible with evolving right PCA territory infarct, largely acute in appearance. Localized gyral edema without significant regional mass effect. No associated hemorrhage. No other evidence for acute or subacute ischemia. Underlying cerebral volume is normal. No significant cerebral white matter changes. No mass lesion, midline shift or mass effect. No hydrocephalus. No extra-axial fluid collection. Pituitary gland suprasellar region normal. Midline structures intact and normal. Vascular: Major intracranial vascular flow voids are maintained. Skull and upper cervical spine: Craniocervical junction normal. Upper cervical spine within normal limits. Bone marrow signal intensity normal. No scalp soft tissue abnormality. Sinuses/Orbits: Globes and orbital soft tissues within normal limits. Paranasal sinuses are clear. No mastoid effusion. Inner ear structures normal. Other: None. IMPRESSION: 1. 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 No associated hemorrhage or significant regional mass effect. 2. Otherwise normal brain MRI. Electronically Signed   By: Rise Mu M.D.   On: 03/17/2018 22:30   Vas [place] Lower Extremity Venous (dvt)  Result Date: 03/18/2018  Lower Venous Study Indications: Stroke.  Performing Technologist: Gertie Fey MHA, RDMS, RVT, RDCS  Examination Guidelines: A complete evaluation includes B-mode imaging, spectral Doppler, color Doppler, and power Doppler as needed of all accessible portions of each vessel. Bilateral testing is considered an integral part of a complete examination. Limited examinations for reoccurring indications may be  performed as noted.  Right Venous Findings: +---------+---------------+---------+-----------+----------+-------+          CompressibilityPhasicitySpontaneityPropertiesSummary +---------+---------------+---------+-----------+----------+-------+ CFV      Full           Yes      Yes                          +---------+---------------+---------+-----------+----------+-------+ SFJ      Full                                                 +---------+---------------+---------+-----------+----------+-------+ FV Prox  Full                                                 +---------+---------------+---------+-----------+----------+-------+ FV Mid   Full                                                 +---------+---------------+---------+-----------+----------+-------+ FV DistalFull                                                 +---------+---------------+---------+-----------+----------+-------+  PFV      Full                                                 +---------+---------------+---------+-----------+----------+-------+ POP      Full           Yes      Yes                          +---------+---------------+---------+-----------+----------+-------+ PTV      Full                                                 +---------+---------------+---------+-----------+----------+-------+ PERO     Full                                                 +---------+---------------+---------+-----------+----------+-------+  Left Venous Findings: +---------+---------------+---------+-----------+----------+-------+          CompressibilityPhasicitySpontaneityPropertiesSummary +---------+---------------+---------+-----------+----------+-------+ CFV      Full           Yes      Yes                          +---------+---------------+---------+-----------+----------+-------+ SFJ      Full                                                  +---------+---------------+---------+-----------+----------+-------+ FV Prox  Full                                                 +---------+---------------+---------+-----------+----------+-------+ FV Mid   Full                                                 +---------+---------------+---------+-----------+----------+-------+ FV DistalFull                                                 +---------+---------------+---------+-----------+----------+-------+ PFV      Full                                                 +---------+---------------+---------+-----------+----------+-------+ POP      Full           Yes      Yes                          +---------+---------------+---------+-----------+----------+-------+  PTV      Full                                                 +---------+---------------+---------+-----------+----------+-------+ PERO     Full                                                 +---------+---------------+---------+-----------+----------+-------+    Summary: Right: There is no evidence of deep vein thrombosis in the lower extremity. No cystic structure found in the popliteal fossa. Left: There is no evidence of deep vein thrombosis in the lower extremity. No cystic structure found in the popliteal fossa.  *See table(s) above for measurements and observations. Electronically signed by Gretta Began MD on 03/18/2018 at 9:42:49 PM.    Final      CBC Recent Labs  Lab 03/17/18 2053  WBC 6.2  HGB 15.1  HCT 43.9  PLT 199  MCV 88.3  MCH 30.4  MCHC 34.4  RDW 12.9  LYMPHSABS 2.0  MONOABS 0.4  EOSABS 0.1  BASOSABS 0.1    Chemistries  Recent Labs  Lab 03/17/18 2053 03/19/18 0449  NA 137 139  K 4.0 4.1  CL 109 104  CO2 24 24  GLUCOSE 105* 109*  BUN 10 11  CREATININE 0.82 0.90  CALCIUM 8.7* 8.8*  AST 25 23  ALT 22 27  ALKPHOS 62 59  BILITOT 0.5 0.5    ------------------------------------------------------------------------------------------------------------------ Recent Labs    03/18/18 0622  CHOL 127  HDL 34*  LDLCALC 86  TRIG 34  CHOLHDL 3.7    Lab Results  Component Value Date   HGBA1C 5.9 (H) 03/18/2018   ------------------------------------------------------------------------------------------------------------------ Recent Labs    03/19/18 0449  TSH 0.895   ------------------------------------------------------------------------------------------------------------------ Recent Labs    03/19/18 0449  VITAMINB12 320    Coagulation profile Recent Labs  Lab 03/17/18 2053  INR 1.02    No results for input(s): DDIMER in the last 72 hours.  Cardiac Enzymes No results for input(s): CKMB, TROPONINI, MYOGLOBIN in the last 168 hours.  Invalid input(s): CK ------------------------------------------------------------------------------------------------------------------ No results found for: BNP   Shon Hale M.D on 03/19/2018 at 2:17 PM  Go to [email or web address] - for contact info  [facility] - Office  [phone]

## 2018-03-19 NOTE — Progress Notes (Signed)
  Echocardiogram Echocardiogram Transesophageal has been performed.  Janalyn Harder 03/19/2018, 11:30 AM

## 2018-03-19 NOTE — Care Management Note (Signed)
Case Management Note  Patient Details  Name: Hector Wilcox MRN: [id number] Date of Birth: Dec 23, 1975  Subjective/Objective:      Pt admitted with stroke. He is from home with roommate.  DME: walker No PCP and no insurance No meds at home No issues with transportation.              Action/Plan: CM met with the patient and he is interested in being set up with one of the [place]. Appt on the AVS. Pt is able to use [facility] pharmacy for assistance with his medications. CM following for d/c meds to see what assist he may need.   Expected Discharge Date:  (unknown)               Expected Discharge Plan:     In-House Referral:     Discharge planning Services  CM Consult, [facility]  Post Acute Care Choice:    Choice offered to:     DME Arranged:    DME Agency:     HH Arranged:    HH Agency:     Status of Service:  In process, will continue to follow  If discussed at Long Length of Stay Meetings, dates discussed:    Additional Comments:  Pollie Friar, RN 03/19/2018, 2:00 PM

## 2018-03-19 NOTE — Transfer of Care (Signed)
Immediate Anesthesia Transfer of Care Note  Patient: Hector Wilcox  Procedure(s) Performed: TRANSESOPHAGEAL ECHOCARDIOGRAM (TEE) (N/A ) BUBBLE STUDY  Patient Location: Endoscopy Unit  Anesthesia Type:MAC  Level of Consciousness: awake, alert  and oriented  Airway & Oxygen Therapy: Patient Spontanous Breathing and Patient connected to nasal cannula oxygen  Post-op Assessment: Report given to RN and Post -op Vital signs reviewed and stable  Post vital signs: Reviewed and stable  Last Vitals:  Vitals Value Taken Time  BP    Temp    Pulse    Resp    SpO2      Last Pain:  Vitals:   03/19/18 1013  TempSrc: Oral  PainSc: 0-No pain         Complications: No apparent anesthesia complications

## 2018-03-19 NOTE — Progress Notes (Addendum)
STROKE TEAM PROGRESS NOTE   HISTORY OF PRESENT ILLNESS (per record)  Hector Wilcox is a 43 y.o. African American male with PMH of GERD, smoker, alcohol user, migraine admitted for HA and blurry vision.  Patient stated that Friday night he had drunk alcohol, smoked and eaten pizza. Saturday morning he woke up with severe b/l frontal HA and gray color vision for 10-15 secs followed with blurry vision bilaterally. Over the next a couple of days, HA gradually improving and location moved from frontal to the back of head, and blurry vision became more slow motion and delay images b/l but more on the left. He went to [facility] ED and CT showed right PCA infarct. He also had MRI confirmed right PCA stroke and CTA head and neck unremarkable. He was transferred to [facility] for further evaluation.  He denies any weakness, numbness, speech difficulty or seizure-like activity.  He denies any heart disease, only complains of GERD, especially at night.  He has been smoking, and light drinking and denies illicit drug use.  His father and brother has a sickle cell disease, and he was told to have sickle cell trait.   SUBJECTIVE (INTERVAL HISTORY) No family at bedside. He says he has a mild h/a today, otherwise just tired and tells me he is napping more than usual. He states he feels his vision has improved from yesterday, still with Left visual filed cut  on exam.he tells me that he likely has sleep apnea as he snores a lot and has been told that he stops breathing while sleeping. He has not had a formal polysomnogram done    OBJECTIVE Vitals:   03/19/18 1130 03/19/18 1135 03/19/18 1140 03/19/18 1244  BP:  (!) 129/57  118/85  Pulse: 92 92  69  Resp: 15 17  16   Temp:    98 F (36.7 C)  TempSrc:    Oral  SpO2: 97% 100% 99% 100%  Weight:      Height:        CBC:  Recent Labs  Lab 03/17/18 2053  WBC 6.2  NEUTROABS 3.5  HGB 15.1  HCT 43.9  MCV 88.3  PLT 199    Basic Metabolic Panel:  Recent Labs  Lab  03/17/18 2053 03/19/18 0449  NA 137 139  K 4.0 4.1  CL 109 104  CO2 24 24  GLUCOSE 105* 109*  BUN 10 11  CREATININE 0.82 0.90  CALCIUM 8.7* 8.8*    Lipid Panel:     Component Value Date/Time   CHOL 127 03/18/2018 0622   TRIG 34 03/18/2018 0622   HDL 34 (L) 03/18/2018 0622   CHOLHDL 3.7 03/18/2018 0622   VLDL 7 03/18/2018 0622   LDLCALC 86 03/18/2018 0622   HgbA1c:  Lab Results  Component Value Date   HGBA1C 5.9 (H) 03/18/2018   Urine Drug Screen:     Component Value Date/Time   LABOPIA NONE DETECTED 03/18/2018 0237   COCAINSCRNUR NONE DETECTED 03/18/2018 0237   LABBENZ NONE DETECTED 03/18/2018 0237   AMPHETMU NONE DETECTED 03/18/2018 0237   THCU NONE DETECTED 03/18/2018 0237   LABBARB NONE DETECTED 03/18/2018 0237    Alcohol Level     Component Value Date/Time   ETH <10 03/17/2018 2053    IMAGING   Ct Angio Head W Or Wo Contrast  Result Date: 03/18/2018 CLINICAL DATA:  Initial evaluation for acute headache, blurry vision. Found to have acute right PCA territory stroke. EXAM: CT ANGIOGRAPHY HEAD AND NECK TECHNIQUE:  Multidetector CT imaging of the head and neck was performed using the standard protocol during bolus administration of intravenous contrast. Multiplanar CT image reconstructions and MIPs were obtained to evaluate the vascular anatomy. Carotid stenosis measurements (when applicable) are obtained utilizing NASCET criteria, using the distal internal carotid diameter as the denominator. CONTRAST:  80mL ISOVUE-370 IOPAMIDOL (ISOVUE-370) INJECTION 76% COMPARISON:  Prior CT and MRI from earlier the same day. FINDINGS: CTA NECK FINDINGS Aortic arch: Visualized aortic arch of normal caliber with normal branch pattern. Mild atheromatous plaque within the arch itself. No flow-limiting stenosis about the origin of the great vessels. Visualized subclavian arteries widely patent. Right carotid system: Right common and internal carotid arteries widely patent without  stenosis, dissection, or occlusion. Left carotid system: Left common and internal carotid arteries widely patent without stenosis, dissection, or occlusion. Vertebral arteries: Both of the vertebral arteries arise from the subclavian arteries. Proximal right vertebral artery not well visualized due to adjacent venous contamination. Vertebral arteries otherwise widely patent within the neck without stenosis, dissection, or occlusion. Skeleton: No acute osseous abnormality. No discrete lytic or blastic osseous lesions. Scattered dental caries noted. Other neck: No other acute soft tissue abnormality within the neck. Upper chest: Visualized upper chest demonstrates no acute finding. Paraseptal and centrilobular emphysematous changes noted. Review of the MIP images confirms the above findings CTA HEAD FINDINGS Anterior circulation: Internal carotid arteries widely patent to the termini without stenosis. A1 segments, anterior communicating artery common anterior cerebral arteries widely patent. M1 segments widely patent bilaterally. Distal MCA branches well perfused and symmetric. Posterior circulation: Vertebral arteries widely patent to the vertebrobasilar junction without stenosis. Right PICA patent. Left PICA not well seen. Basilar widely patent to its distal aspect without stenosis. Superior cerebral arteries patent bilaterally. Posterior cerebral arteries primarily supplied via the basilar, although small bilateral posterior communicating arteries noted. PCAs are patent to their distal aspects without proximal occlusion or stenosis. Venous sinuses: Patent. Anatomic variants: None significant. Delayed phase: Evolving right PCA territory infarct. No other abnormal enhancement. Review of the MIP images confirms the above findings IMPRESSION: 1. Negative CTA of the head and neck. No large vessel occlusion. No hemodynamically significant stenosis or other acute vascular abnormality. 2. Emphysema. Electronically Signed    By: Rise Mu M.D.   On: 03/18/2018 00:00   Ct Head Wo Contrast  Result Date: 03/17/2018 CLINICAL DATA:  Headaches and bilateral blurred vision for 4 days. EXAM: CT HEAD WITHOUT CONTRAST TECHNIQUE: Contiguous axial images were obtained from the base of the skull through the vertex without intravenous contrast. COMPARISON:  None. FINDINGS: Brain: Focal poorly defined low-attenuation area with associated sulcal effacement in the right medial occipital lobe consistent with acute infarct in the distribution of the right posterior cerebral artery. Abscess or tumor would be a less likely consideration. MRI correlation is suggested. No acute intracranial hemorrhage. Otherwise, ventricles and sulci are symmetrical. No abnormal extra-axial fluid collections. Gray-white matter junctions are distinct. Basal cisterns are not effaced. No ventricular dilatation. Vascular: No hyperdense vessel or unexpected calcification. Skull: Calvarium appears intact. Sinuses/Orbits: Paranasal sinuses and mastoid air cells are clear. Other: None. IMPRESSION: Acute infarct in the distribution of the right posterior cerebral artery. No acute intracranial hemorrhage. Electronically Signed   By: Burman Nieves M.D.   On: 03/17/2018 20:25   Ct Angio Neck W And/or Wo Contrast  Result Date: 03/18/2018 CLINICAL DATA:  Initial evaluation for acute headache, blurry vision. Found to have acute right PCA territory stroke. EXAM: CT ANGIOGRAPHY HEAD  AND NECK TECHNIQUE: Multidetector CT imaging of the head and neck was performed using the standard protocol during bolus administration of intravenous contrast. Multiplanar CT image reconstructions and MIPs were obtained to evaluate the vascular anatomy. Carotid stenosis measurements (when applicable) are obtained utilizing NASCET criteria, using the distal internal carotid diameter as the denominator. CONTRAST:  87mL ISOVUE-370 IOPAMIDOL (ISOVUE-370) INJECTION 76% COMPARISON:  Prior CT and MRI  from earlier the same day. FINDINGS: CTA NECK FINDINGS Aortic arch: Visualized aortic arch of normal caliber with normal branch pattern. Mild atheromatous plaque within the arch itself. No flow-limiting stenosis about the origin of the great vessels. Visualized subclavian arteries widely patent. Right carotid system: Right common and internal carotid arteries widely patent without stenosis, dissection, or occlusion. Left carotid system: Left common and internal carotid arteries widely patent without stenosis, dissection, or occlusion. Vertebral arteries: Both of the vertebral arteries arise from the subclavian arteries. Proximal right vertebral artery not well visualized due to adjacent venous contamination. Vertebral arteries otherwise widely patent within the neck without stenosis, dissection, or occlusion. Skeleton: No acute osseous abnormality. No discrete lytic or blastic osseous lesions. Scattered dental caries noted. Other neck: No other acute soft tissue abnormality within the neck. Upper chest: Visualized upper chest demonstrates no acute finding. Paraseptal and centrilobular emphysematous changes noted. Review of the MIP images confirms the above findings CTA HEAD FINDINGS Anterior circulation: Internal carotid arteries widely patent to the termini without stenosis. A1 segments, anterior communicating artery common anterior cerebral arteries widely patent. M1 segments widely patent bilaterally. Distal MCA branches well perfused and symmetric. Posterior circulation: Vertebral arteries widely patent to the vertebrobasilar junction without stenosis. Right PICA patent. Left PICA not well seen. Basilar widely patent to its distal aspect without stenosis. Superior cerebral arteries patent bilaterally. Posterior cerebral arteries primarily supplied via the basilar, although small bilateral posterior communicating arteries noted. PCAs are patent to their distal aspects without proximal occlusion or stenosis.  Venous sinuses: Patent. Anatomic variants: None significant. Delayed phase: Evolving right PCA territory infarct. No other abnormal enhancement. Review of the MIP images confirms the above findings IMPRESSION: 1. Negative CTA of the head and neck. No large vessel occlusion. No hemodynamically significant stenosis or other acute vascular abnormality. 2. Emphysema. Electronically Signed   By: Rise Mu M.D.   On: 03/18/2018 00:00   Mr Brain Wo Contrast  Result Date: 03/17/2018 CLINICAL DATA:  Follow-up examination for acute stroke. EXAM: MRI HEAD WITHOUT CONTRAST TECHNIQUE: Multiplanar, multiecho pulse sequences of the brain and surrounding structures were obtained without intravenous contrast. COMPARISON:  Prior head CT from earlier same day. FINDINGS: Brain: Confluent diffusion abnormality involving the parasagittal cortical gray matter of the right temporal occipital region compatible with evolving right PCA territory infarct, largely acute in appearance. Localized gyral edema without significant regional mass effect. No associated hemorrhage. No other evidence for acute or subacute ischemia. Underlying cerebral volume is normal. No significant cerebral white matter changes. No mass lesion, midline shift or mass effect. No hydrocephalus. No extra-axial fluid collection. Pituitary gland suprasellar region normal. Midline structures intact and normal. Vascular: Major intracranial vascular flow voids are maintained. Skull and upper cervical spine: Craniocervical junction normal. Upper cervical spine within normal limits. Bone marrow signal intensity normal. No scalp soft tissue abnormality. Sinuses/Orbits: Globes and orbital soft tissues within normal limits. Paranasal sinuses are clear. No mastoid effusion. Inner ear structures normal. Other: None. IMPRESSION: 1. 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 No associated hemorrhage or significant regional mass effect. 2.  Otherwise normal brain  MRI. Electronically Signed   By: Rise Mu M.D.   On: 03/17/2018 22:30   Vas [place] Lower Extremity Venous (dvt)  Result Date: 03/18/2018  Lower Venous Study Indications: Stroke.  Performing Technologist: Gertie Fey MHA, RDMS, RVT, RDCS  Examination Guidelines: A complete evaluation includes B-mode imaging, spectral Doppler, color Doppler, and power Doppler as needed of all accessible portions of each vessel. Bilateral testing is considered an integral part of a complete examination. Limited examinations for reoccurring indications may be performed as noted.  Right Venous Findings: +---------+---------------+---------+-----------+----------+-------+          CompressibilityPhasicitySpontaneityPropertiesSummary +---------+---------------+---------+-----------+----------+-------+ CFV      Full           Yes      Yes                          +---------+---------------+---------+-----------+----------+-------+ SFJ      Full                                                 +---------+---------------+---------+-----------+----------+-------+ FV Prox  Full                                                 +---------+---------------+---------+-----------+----------+-------+ FV Mid   Full                                                 +---------+---------------+---------+-----------+----------+-------+ FV DistalFull                                                 +---------+---------------+---------+-----------+----------+-------+ PFV      Full                                                 +---------+---------------+---------+-----------+----------+-------+ POP      Full           Yes      Yes                          +---------+---------------+---------+-----------+----------+-------+ PTV      Full                                                 +---------+---------------+---------+-----------+----------+-------+ PERO     Full                                                  +---------+---------------+---------+-----------+----------+-------+  Left Venous Findings: +---------+---------------+---------+-----------+----------+-------+  CompressibilityPhasicitySpontaneityPropertiesSummary +---------+---------------+---------+-----------+----------+-------+ CFV      Full           Yes      Yes                          +---------+---------------+---------+-----------+----------+-------+ SFJ      Full                                                 +---------+---------------+---------+-----------+----------+-------+ FV Prox  Full                                                 +---------+---------------+---------+-----------+----------+-------+ FV Mid   Full                                                 +---------+---------------+---------+-----------+----------+-------+ FV DistalFull                                                 +---------+---------------+---------+-----------+----------+-------+ PFV      Full                                                 +---------+---------------+---------+-----------+----------+-------+ POP      Full           Yes      Yes                          +---------+---------------+---------+-----------+----------+-------+ PTV      Full                                                 +---------+---------------+---------+-----------+----------+-------+ PERO     Full                                                 +---------+---------------+---------+-----------+----------+-------+    Summary: Right: There is no evidence of deep vein thrombosis in the lower extremity. No cystic structure found in the popliteal fossa. Left: There is no evidence of deep vein thrombosis in the lower extremity. No cystic structure found in the popliteal fossa.  *See table(s) above for measurements and observations. Electronically signed by Gretta Began MD on 03/18/2018  at 9:42:49 PM.    Final      Transthoracic Echocardiogram  Neg TEE neg   PHYSICAL EXAM Blood pressure 118/85, pulse 69, temperature 98 F (36.7 C), temperature source Oral, resp. rate 16, height 5\' 9"  (1.753 m), weight 60 kg, SpO2 100 %.  General - well nourished,  well developed, in no apparent distress.   Ophthalmologic - conjugate gaze, left VFC noted Cardiovascular - regular rate and rhythm Resp- normal respirations, breath sounds  Mental Status -  Level of arousal and orientation to time, place, and person were intact. Language including expression, naming, repetition, comprehension, reading, and writing was assessed and found intact. Attention span and concentration were normal. Fund of Knowledge was assessed and was intact.  Cranial Nerves II - XII - II - visual field testing showed left upper quadrantanopsia. III, IV, VI - Extraocular movements intact. V - Facial sensation intact bilaterally. VII - Facial movement intact bilaterally. VIII - Hearing & vestibular intact bilaterally. X - Palate elevates symmetrically. XI - Chin turning & shoulder shrug intact bilaterally. XII - Tongue protrusion intact.  Motor Strength - The patient's strength was normal in all extremities and pronator drift was absent.   Motor Tone & Bulk - Muscle tone was assessed at the neck and appendages and was normal.  Bulk was normal and fasciculations were absent.   Reflexes - The patient's reflexes were normal in all extremities and he had no pathological reflexes.  Sensory - Light touch, temperature/pinprick were assessed and were normal.    Coordination - The patient had normal movements in the hands and feet with no ataxia or dysmetria.  Tremor was absent.  Gait and Station - up freely ambulating in room    HOME MEDICATIONS:  Medications Prior to Admission  Medication Sig Dispense Refill  . alum & mag hydroxide-simeth (MAALOX PLUS) 400-400-40 MG/5ML suspension Take 15 mLs  by mouth as needed for indigestion.    . Cal Carb-Mag Hydrox-Simeth (ROLAIDS ADVANCED PO) Take 3 tablets by mouth as needed (heartburn).    . cetirizine (ZYRTEC) 10 MG tablet Take 10 mg by mouth daily as needed for allergies.    . hydroxypropyl methylcellulose / hypromellose (ISOPTO TEARS / GONIOVISC) 2.5 % ophthalmic solution Place 1 drop into both eyes as needed for dry eyes.    Marland Kitchen. bismuth subsalicylate (PEPTO-BISMOL) 262 MG/15ML suspension Take 30 mLs by mouth 4 (four) times daily -  before meals and at bedtime. (Patient not taking: Reported on 03/17/2018) 1700 mL 0  . famotidine (PEPCID) 20 MG tablet Take 1 tablet (20 mg total) by mouth 2 (two) times daily. (Patient not taking: Reported on 03/17/2018) 30 tablet 0  . fluticasone (FLONASE) 50 MCG/ACT nasal spray Place 2 sprays into both nostrils daily. (Patient not taking: Reported on 07/10/2017) 1 g 0  . pantoprazole (PROTONIX) 40 MG tablet Take 1 tablet (40 mg total) by mouth daily. (Patient not taking: Reported on 03/17/2018) 30 tablet 0  . sucralfate (CARAFATE) 1 g tablet Take 1 tablet (1 g total) by mouth 4 (four) times daily -  with meals and at bedtime. (Patient not taking: Reported on 03/17/2018) 30 tablet 0      HOSPITAL MEDICATIONS:  . aspirin  81 mg Oral Daily  . atorvastatin  40 mg Oral q1800  . clopidogrel  75 mg Oral Daily  . enoxaparin (LOVENOX) injection  40 mg Subcutaneous Q24H  . folic acid  1 mg Oral Daily  . LORazepam  1 mg Oral QHS  . multivitamin with minerals  1 tablet Oral Daily  . [START ON 03/20/2018] nicotine  14 mg Transdermal Daily  . pantoprazole  40 mg Oral Daily  . sucralfate  1 g Oral TID WC & HS  . thiamine  100 mg Oral Daily   Or  . thiamine  100  mg Intravenous Daily    ALLERGIES Allergies  Allergen Reactions  . Tramadol Swelling    lips   ASSESSMENT/PLAN Mr. Cleon GustinMario D Saddler is a 43 y.o. male with history of GERD, smoker, alcohol user, migraine admitted for HA and blurry vision.  Exam showed left upper  quadrantanopsia. CT showed right PCA infarct, which confirmed on MRI. CTA head and neck unremarkable.  2D echo pending.  LDL 86 and A1c 5.9.  UDS negative.  Although he has stroke risk factor of smoking, alcohol, migraine, sickle cell trait, for his young age with unremarkable vasculature, further cardioembolic work-up is warranted.   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  Permissive hypertension (OK if < 220/120) but gradually normalize in 5-7 days .  Long-term BP goal normotensive  Hyperlipidemia  Lipid lowering medication PTA:  none  LDL 86, goal < 70  Current lipid lowering medication: Lipitor 40mg hContinue statin at discharge   Other Stroke Risk Factors  Cigarette smoker, I have advised to stop smoking  ETOH use, advised to drink no more than 1-2 drink(s) a day  Family hx stroke  Possible Obstructive sleep apnea, willing to participate in Sleep Smart study     Hospital day # 1  Desiree Metzger-Cihelka, ARNP-C, ANVP-BC I have personally obtained history,examined this patient, reviewed notes, independently viewed imaging studies, participated in medical decision making and plan of care.ROS completed by me personally and pertinent positives fully documented  I have made any additions or clarifications directly to the above note. Agree with note above. he presented with a five-day history of blurred vision and headache due to right PCA infarct etiology to be determined.vascular risk factors of  smoking, alcohol and mild hyperlipidemia, obesity and high suspicion for sleep apnea. Recommend dual antiplatelet therapy for 3 weeks followed by aspirin alone. Smoking cessation counseling. Patient counseled to limit alcohol intake. We will not do loop recorder given his young age and few risk factors.Follow hypercoagulable panel lab results.Patient is interested in participating in the sleep smart stroke prevention trial .discussed with patient and Dr. Marisa Severinourage. Patient is advised not to drive till his vision loss improves.Greater than 50% time during this 35 minute visit was spent on counseling and coordination of care about his cryptogenic stroke and answering questions. Delia HeadyPramod , MD Medical Director [facility] Pager: [phone] 03/19/2018 5:04 PM  To contact Stroke Continuity provider, please refer to [email or web address]. After hours, contact General Neurology

## 2018-03-19 NOTE — Evaluation (Signed)
Physical Therapy Evaluation & Discharge Patient Details Name: Hector Wilcox MRN: [id number] DOB: May 06, 1975 Today's Date: 03/19/2018   History of Present Illness  Pt is a 43 y/o male admitted secondary to visual changes and headaches. 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 PMH including but not limited to sickle cell trait.  Clinical Impression  Pt presented supine in bed with HOB elevated, awake and willing to participate in therapy session. Prior to admission, pt reported that he was independent with all functional mobility and ADLs. Pt lives with a roommate in a single level house with a level entry. Pt currently at supervision to independent level for all functional mobility including stair negotiation. Pt participated in a higher level balance assessment and scored a 24/24 on the DGI indicating that he is a safe community ambulator. Pt reported feeling back to his baseline in regards to functional mobility. No further acute PT needs identified at this time. PT signing off.     Follow Up Recommendations No PT follow up    Equipment Recommendations  None recommended by PT    Recommendations for Other Services       Precautions / Restrictions Precautions Precautions: None Restrictions Weight Bearing Restrictions: No      Mobility  Bed Mobility Overal bed mobility: Independent                Transfers Overall transfer level: Independent                  Ambulation/Gait Ambulation/Gait assistance: Supervision Gait Distance (Feet): 200 Feet Assistive device: None Gait Pattern/deviations: Step-through pattern;Drifts right/left Gait velocity: able to fluctuate   General Gait Details: pt initially with mild instability but progressively improving with no LOB or need for physical assistance  Stairs Stairs: Yes Stairs assistance: Supervision Stair Management: No rails;Alternating pattern;Forwards Number of Stairs: 2(two steps x5  bouts)    Wheelchair Mobility    Modified Rankin (Stroke Patients Only) Modified Rankin (Stroke Patients Only) Pre-Morbid Rankin Score: No symptoms Modified Rankin: Slight disability     Balance Overall balance assessment: Modified Independent                               Standardized Balance Assessment Standardized Balance Assessment : Dynamic Gait Index   Dynamic Gait Index Level Surface: Normal Change in Gait Speed: Normal Gait with Horizontal Head Turns: Normal Gait with Vertical Head Turns: Normal Gait and Pivot Turn: Normal Step Over Obstacle: Normal Step Around Obstacles: Normal Steps: Normal Total Score: 24       Pertinent Vitals/Pain Pain Assessment: 0-10 Pain Score: 7  Pain Location: headache Pain Descriptors / Indicators: Headache Pain Intervention(s): Monitored during session;Repositioned;Patient requesting pain meds-RN notified    Home Living Family/patient expects to be discharged to:: Private residence Living Arrangements: Non-relatives/Friends Available Help at Discharge: Family;Available PRN/intermittently Type of Home: House Home Access: Level entry     Home Layout: One level Home Equipment: None      Prior Function Level of Independence: Independent               Hand Dominance   Dominant Hand: Right    Extremity/Trunk Assessment   Upper Extremity Assessment Upper Extremity Assessment: Overall WFL for tasks assessed    Lower Extremity Assessment Lower Extremity Assessment: Overall WFL for tasks assessed    Cervical / Trunk Assessment Cervical / Trunk Assessment: Normal  Communication   Communication: No difficulties  Cognition Arousal/Alertness: Awake/alert Behavior During Therapy: WFL for tasks assessed/performed Overall Cognitive Status: Within Functional Limits for tasks assessed                                        General Comments      Exercises     Assessment/Plan    PT  Assessment Patent does not need any further PT services  PT Problem List         PT Treatment Interventions      PT Goals (Current goals can be found in the Care Plan section)  Acute Rehab PT Goals Patient Stated Goal: "to go home" PT Goal Formulation: All assessment and education complete, DC therapy    Frequency     Barriers to discharge        Co-evaluation               AM-PAC PT "6 Clicks" Mobility  Outcome Measure Help needed turning from your back to your side while in a flat bed without using bedrails?: None Help needed moving from lying on your back to sitting on the side of a flat bed without using bedrails?: None Help needed moving to and from a bed to a chair (including a wheelchair)?: None Help needed standing up from a chair using your arms (e.g., wheelchair or bedside chair)?: None Help needed to walk in hospital room?: None Help needed climbing 3-5 steps with a railing? : None 6 Click Score: 24    End of Session   Activity Tolerance: Patient tolerated treatment well Patient left: in bed;with call bell/phone within reach Nurse Communication: Mobility status;Patient requests pain meds PT Visit Diagnosis: Other symptoms and signs involving the nervous system (R29.898)    Time: [id number] PT Time Calculation (min) (ACUTE ONLY): 16 min   Charges:   PT Evaluation $PT Eval Low Complexity: 1 Low          Deborah ChalkJennifer Mayvis Agudelo, PT, DPT  Acute Rehabilitation Services Pager [phone] Office [phone]    Alessandra BevelsJennifer M Bassam Dresch 03/19/2018, 4:44 PM

## 2018-03-19 NOTE — Progress Notes (Signed)
    Transesophageal Echocardiogram Note  KYAH ROSSER [id number] [birth year]  Procedure: Transesophageal Echocardiogram Indications: CVA   Procedure Details Consent: Obtained Time Out: Verified patient identification, verified procedure, site/side was marked, verified correct patient position, special equipment/implants available, Radiology Safety Procedures followed,  medications/allergies/relevent history reviewed, required imaging and test results available.  Performed  Medications:  Pt sedated by anesthesia with diprovan 240 mg IV.  Study technically difficult due to poor patient tolerance.  Normal LV function; no LAA thrombus; negative saline microcavitation study.   Complications: No apparent complications Patient did tolerate procedure well.  [name], MD

## 2018-03-19 NOTE — Anesthesia Preprocedure Evaluation (Signed)
Anesthesia Evaluation    Airway Mallampati: II  TM Distance: >3 FB Neck ROM: Full    Dental  (+) Poor Dentition, Chipped, Loose,    Pulmonary Current Smoker,    Pulmonary exam normal breath sounds clear to auscultation       Cardiovascular negative cardio ROS Normal cardiovascular exam Rhythm:Regular Rate:Normal     Neuro/Psych Blurring of vision CVA, Residual Symptoms negative psych ROS   GI/Hepatic GERD  Medicated and Controlled,(+)     substance abuse  alcohol use,   Endo/Other  negative endocrine ROS  Renal/GU negative Renal ROS  negative genitourinary   Musculoskeletal negative musculoskeletal ROS (+)   Abdominal   Peds  Hematology  (+) Sickle cell trait ,   Anesthesia Other Findings   Reproductive/Obstetrics                             Anesthesia Physical Anesthesia Plan  ASA: II  Anesthesia Plan: MAC   Post-op Pain Management:    Induction: Intravenous  PONV Risk Score and Plan: 0 and Treatment may vary due to age or medical condition, Ondansetron and Propofol infusion  Airway Management Planned: Natural Airway and Nasal Cannula  Additional Equipment:   Intra-op Plan:   Post-operative Plan:   Informed Consent: I have reviewed the patients History and Physical, chart, labs and discussed the procedure including the risks, benefits and alternatives for the proposed anesthesia with the patient or authorized representative who has indicated his/her understanding and acceptance.     Dental advisory given  Plan Discussed with: CRNA and Surgeon  Anesthesia Plan Comments:         Anesthesia Quick Evaluation

## 2018-03-20 LAB — BETA-2-GLYCOPROTEIN I ABS, IGG/M/A
Beta-2 Glyco I IgG: <9 GPI IgG units (ref 0–20)
Beta-2-Glycoprotein I IgA: <9 GPI IgA units (ref 0–25)
Beta-2-Glycoprotein I IgM: <9 GPI IgM units (ref 0–32)

## 2018-03-20 LAB — CARDIOLIPIN ANTIBODIES, IGG, IGM, IGA
Anticardiolipin IgA: <9 APL U/mL (ref 0–11)
Anticardiolipin IgG: <9 GPL U/mL (ref 0–14)

## 2018-03-20 LAB — ANTI-DNA ANTIBODY, DOUBLE-STRANDED: ds DNA Ab: <1 IU/mL (ref 0–9)

## 2018-03-20 LAB — SICKLE CELL SCREEN: Sickle Cell Screen: POSITIVE — AB

## 2018-03-20 LAB — ANTINUCLEAR ANTIBODIES, IFA: ANA Ab, IFA: NEGATIVE

## 2018-03-20 LAB — LUPUS ANTICOAGULANT PANEL
DRVVT: 44.7 s (ref 0.0–47.0)
PTT Lupus Anticoagulant: 30.5 s (ref 0.0–51.9)

## 2018-03-20 LAB — HOMOCYSTEINE: Homocysteine: 14.8 umol/L — ABNORMAL HIGH (ref 0.0–14.5)

## 2018-03-20 MED ORDER — ASPIRIN EC 81 MG PO TBEC: 81.0000 mg | DELAYED_RELEASE_TABLET | Freq: Every day | ORAL | 11 refills | Status: DC

## 2018-03-20 MED ORDER — THIAMINE HCL 100 MG PO TABS: 100.0000 mg | ORAL_TABLET | Freq: Every day | ORAL | 5 refills | Status: DC

## 2018-03-20 MED ORDER — ATORVASTATIN CALCIUM 40 MG PO TABS: 40.0000 mg | ORAL_TABLET | Freq: Every evening | ORAL | 5 refills | Status: AC

## 2018-03-20 MED ORDER — OMEPRAZOLE 20 MG PO CPDR: 20.0000 mg | DELAYED_RELEASE_CAPSULE | Freq: Every day | ORAL | 1 refills | Status: DC

## 2018-03-20 MED ORDER — FOLIC ACID 1 MG PO TABS: 1.0000 mg | ORAL_TABLET | Freq: Every day | ORAL | 5 refills | Status: AC

## 2018-03-20 MED ORDER — CLOPIDOGREL BISULFATE 75 MG PO TABS: 75.0000 mg | ORAL_TABLET | Freq: Every day | ORAL | 0 refills | Status: DC

## 2018-03-20 MED ORDER — ADULT MULTIVITAMIN W/MINERALS CH: 1.0000 | ORAL_TABLET | Freq: Every day | ORAL | 5 refills | Status: AC

## 2018-03-20 MED ORDER — SUCRALFATE 1 G PO TABS: 1.0000 g | ORAL_TABLET | Freq: Three times a day (TID) | ORAL | 1 refills | Status: DC

## 2018-03-20 MED ORDER — NICOTINE 14 MG/24HR TD PT24: 14.0000 mg | MEDICATED_PATCH | Freq: Every day | TRANSDERMAL | 0 refills | Status: DC

## 2018-03-20 MED FILL — OMEPRAZOLE 20 MG CPDR: 20 | 30 days supply | Qty: 30 | Fill #0 | Status: TO

## 2018-03-20 MED FILL — FOLIC ACID 1 MG TABS: 1 | 30 days supply | Qty: 30 | Fill #0 | Status: TO

## 2018-03-20 MED FILL — THIAMINE HCL 100 MG TABS: 100 | 30 days supply | Qty: 30 | Fill #0 | Status: TO

## 2018-03-20 MED FILL — SUCRALFATE 1 GM TABLET: 1 | 30 days supply | Qty: 120 | Fill #0 | Status: TO

## 2018-03-20 MED FILL — ATORVASTATIN CALCIUM 40 MG: 40 | 30 days supply | Qty: 30 | Fill #0 | Status: TO

## 2018-03-20 NOTE — Progress Notes (Signed)
STROKE TEAM PROGRESS NOTE       SUBJECTIVE (INTERVAL HISTORY) No family at bedside. Hector Wilcox says  Hector Wilcox is doing well. 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 Hector Wilcox did participate in the sleep smart trial and tested positive on the [place] 3 screening tests last night. Hector Wilcox will have outpatient CPAP tolerability test in the sleep lab Today   OBJECTIVE Vitals:   03/19/18 2343 03/20/18 0415 03/20/18 0752 03/20/18 1216  BP: 131/75 (!) 99/52 119/84 113/66  Pulse: 72 72 66 84  Resp: 17 16 16 15   Temp: 98.1 F (36.7 C) 98 F (36.7 C) 98.3 F (36.8 C) 98 F (36.7 C)  TempSrc: Oral  Oral Oral  SpO2: 100% 100% 98% 96%  Weight:      Height:        CBC:  Recent Labs  Lab 03/17/18 2053  WBC 6.2  NEUTROABS 3.5  HGB 15.1  HCT 43.9  MCV 88.3  PLT 199    Basic Metabolic Panel:  Recent Labs  Lab 03/17/18 2053 03/19/18 0449  NA 137 139  K 4.0 4.1  CL 109 104  CO2 24 24  GLUCOSE 105* 109*  BUN 10 11  CREATININE 0.82 0.90  CALCIUM 8.7* 8.8*    Lipid Panel:     Component Value Date/Time   CHOL 127 03/18/2018 0622   TRIG 34 03/18/2018 0622   HDL 34 (L) 03/18/2018 0622   CHOLHDL 3.7 03/18/2018 0622   VLDL 7 03/18/2018 0622   LDLCALC 86 03/18/2018 0622   HgbA1c:  Lab Results  Component Value Date   HGBA1C 5.9 (H) 03/18/2018   Urine Drug Screen:     Component Value Date/Time   LABOPIA NONE DETECTED 03/18/2018 0237   COCAINSCRNUR NONE DETECTED 03/18/2018 0237   LABBENZ NONE DETECTED 03/18/2018 0237   AMPHETMU NONE DETECTED 03/18/2018 0237   THCU NONE DETECTED 03/18/2018 0237   LABBARB NONE DETECTED 03/18/2018 0237    Alcohol Level     Component Value Date/Time   ETH <10 03/17/2018 2053    IMAGING   No results found.   Transthoracic Echocardiogram  Neg TEE neg   PHYSICAL EXAM Blood pressure 113/66, pulse 84, temperature 98 F (36.7 C), temperature source Oral, resp. rate 15, height 5\' 9"  (1.753 m), weight 60 kg, SpO2 96 %.  General - well  nourished, well developed, in no apparent distress.   Ophthalmologic - conjugate gaze, left VFC noted Cardiovascular - regular rate and rhythm Resp- normal respirations, breath sounds  Mental Status -  Level of arousal and orientation to time, place, and person were intact. Language including expression, naming, repetition, comprehension, reading, and writing was assessed and found intact. Attention span and concentration were normal. Fund of Knowledge was assessed and was intact.  Cranial Nerves II - XII - II - visual field testing showed left upper quadrantanopsia. III, IV, VI - Extraocular movements intact. V - Facial sensation intact bilaterally. VII - Facial movement intact bilaterally. VIII - Hearing & vestibular intact bilaterally. X - Palate elevates symmetrically. XI - Chin turning & shoulder shrug intact bilaterally. XII - Tongue protrusion intact.  Motor Strength - The patient's strength was normal in all extremities and pronator drift was absent.   Motor Tone & Bulk - Muscle tone was assessed at the neck and appendages and was normal.  Bulk was normal and fasciculations were absent.   Reflexes - The patient's reflexes were normal in all extremities and Hector Wilcox had no pathological reflexes.  Sensory -  Light touch, temperature/pinprick were assessed and were normal.    Coordination - The patient had normal movements in the hands and feet with no ataxia or dysmetria.  Tremor was absent.  Gait and Station - up freely ambulating in room    HOME MEDICATIONS:  Medications Prior to Admission  Medication Sig Dispense Refill  . alum & mag hydroxide-simeth (MAALOX PLUS) 400-400-40 MG/5ML suspension Take 15 mLs by mouth as needed for indigestion.    . Cal Carb-Mag Hydrox-Simeth (ROLAIDS ADVANCED PO) Take 3 tablets by mouth as needed (heartburn).    . cetirizine (ZYRTEC) 10 MG tablet Take 10 mg by mouth daily as needed for allergies.    . hydroxypropyl methylcellulose /  hypromellose (ISOPTO TEARS / GONIOVISC) 2.5 % ophthalmic solution Place 1 drop into both eyes as needed for dry eyes.    Marland Kitchen. bismuth subsalicylate (PEPTO-BISMOL) 262 MG/15ML suspension Take 30 mLs by mouth 4 (four) times daily -  before meals and at bedtime. (Patient not taking: Reported on 03/17/2018) 1700 mL 0  . famotidine (PEPCID) 20 MG tablet Take 1 tablet (20 mg total) by mouth 2 (two) times daily. (Patient not taking: Reported on 03/17/2018) 30 tablet 0  . fluticasone (FLONASE) 50 MCG/ACT nasal spray Place 2 sprays into both nostrils daily. (Patient not taking: Reported on 07/10/2017) 1 g 0  . pantoprazole (PROTONIX) 40 MG tablet Take 1 tablet (40 mg total) by mouth daily. (Patient not taking: Reported on 03/17/2018) 30 tablet 0  . [DISCONTINUED] sucralfate (CARAFATE) 1 g tablet Take 1 tablet (1 g total) by mouth 4 (four) times daily -  with meals and at bedtime. (Patient not taking: Reported on 03/17/2018) 30 tablet 0      HOSPITAL MEDICATIONS:  . aspirin  81 mg Oral Daily  . atorvastatin  40 mg Oral q1800  . clopidogrel  75 mg Oral Daily  . enoxaparin (LOVENOX) injection  40 mg Subcutaneous Q24H  . folic acid  1 mg Oral Daily  . LORazepam  1 mg Oral QHS  . multivitamin with minerals  1 tablet Oral Daily  . nicotine  14 mg Transdermal Daily  . pantoprazole  40 mg Oral Daily  . sucralfate  1 g Oral TID WC & HS  . thiamine  100 mg Oral Daily   Or  . thiamine  100 mg Intravenous Daily    ALLERGIES Allergies  Allergen Reactions  . Tramadol Swelling    lips   ASSESSMENT/PLAN [name] is a 43 y.o. male with history of GERD, smoker, alcohol user, migraine admitted for HA and blurry vision.  Exam showed left upper quadrantanopsia. CT showed right PCA infarct, which confirmed on MRI. CTA head and neck unremarkable.  2D echo pending.  LDL 86 and A1c 5.9.  UDS negative.  Although Hector Wilcox has stroke risk factor of smoking, alcohol, migraine, sickle cell trait, for his young age with  unremarkable vasculature, further cardioembolic work-up is warranted.   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  Permissive hypertension (OK if < 220/120) but gradually normalize in 5-7 days .  Long-term BP goal normotensive  Hyperlipidemia  Lipid lowering medication PTA:  none  LDL 86, goal < 70  Current lipid lowering medication: Lipitor 40mg hContinue statin at discharge   Other Stroke Risk Factors  Cigarette smoker, I have advised to stop smoking  ETOH use, advised to drink no more than 1-2 drink(s) a day  Family hx stroke  Possible Obstructive sleep apnea, willing to participate in Sleep Smart study     Hospital day # 2   . Hector Wilcox presented with a five-day history of blurred vision and headache due to right PCA infarct etiology to be determined.vascular risk factors of smoking, alcohol and mild hyperlipidemia, obesity and high suspicion for sleep apnea. Recommend dual antiplatelet therapy for 3 weeks followed by aspirin alone. Smoking cessation counseling. Patient counseled to limit alcohol intake. We will not do loop recorder given his young age and few risk factors.Follow hypercoagulable panel lab results.Patient is interested in participating in the sleep smart stroke prevention trial ) tested positive on the [place] 3 screen. Hector Wilcox will have CPAP tolerability test in [facility] sleep lab on Saturday 03/21/18. Stroke research team will call patient and arrange this.Marland Kitchen.discussed with patient and Dr. Marisa Severinourage.  Patient is advised not to drive till his vision loss improves.Greater than 50% time during this 25 minute visit was spent on counseling and coordination of care about his cryptogenic stroke and answering questions. Delia HeadyPramod Benji Poynter, MD Medical Director [facility] Pager: [phone] 03/20/2018 3:43 PM  To contact Stroke Continuity provider, please refer to [email or web address]. After hours, contact General Neurology

## 2018-03-20 NOTE — Discharge Summary (Signed)
Hector Wilcox, is a 43 y.o. male  DOB 11/17/1975  MRN [id number].  Admission date:  03/17/2018  Admitting Physician  Richarda OverlieNayana Abrol, MD  Discharge Date:  03/20/2018   Primary MD  Patient, No Pcp Per  Recommendations for primary care physician for things to follow:  1)Take Plavix 75 mg daily + Aspirin 81 mg with Food Daily For 3 weeks , then STOP Plavix and Take Aspirin only 2)Avoid ibuprofen/Advil/Aleve/Motrin/Goody Powders/Naproxen/BC powders/Meloxicam/Diclofenac/Indomethacin and other Nonsteroidal anti-inflammatory medications as these will make you more likely to bleed and can cause stomach ulcers, can also cause Kidney problems.  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 Acid reflux   . Sickle cell trait [facility](HCC)     Past Surgical History:  Procedure Laterality Date  . TEE WITHOUT CARDIOVERSION N/A 03/19/2018   Procedure: TRANSESOPHAGEAL ECHOCARDIOGRAM (TEE);  Surgeon: Lewayne Buntingrenshaw, Brian S, MD;  [facility];  Service: Cardiovascular;  Laterality: N/A;       HPI  from the history and physical done on the day of admission:    Chief Complaint: HA  HPI: Hector Wilcox is a 43 y.o. male with medical history significant of GERD presents to the ER with visual changes and  headache.  Last time normal was on Saturday, when he woke up for a severe generalized headache.  He noted blurry vision bilaterally.  He denies any dizziness nausea vomiting numbness or  tingling or weakness of his extremities.  He has had migraines in the past but this headache was different per report.  He did have some alcohol on Friday night.  He does smoke cigarettes but denies any  illegal drug use. 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  ED provider spoke with Dr. Anise SalvokirPatrick neurology who requests admission to [facility] for further evaluation and treatment.  CTA head and neck has been ordered and pending.  Patient received a migraine cocktail with Decadron IV Benadryl and Compazine.  With improvement of his headache   Hospital Course:   Brief Summary [id number] Old male with past medical history relevant for migraine headaches, tobacco abuse, and GERD, sickle cell trait and alcohol abuse admitted 03/17/18 with acute Rt PCA stroke findings and left eye visual loss... MRI brain confirms right PCA stroke, CTA head and CTA neck without acute findings, [facility] for further neuro evaluation and TEE   Plan:- 1)Acute Rt PCA CVA----   left eye visual disturbance persist, TEE on 03/18/18 shows Normal LV function; no LAA thrombus; negative saline microcavitation  study--- discussed with Dr. Pearlean Brownie from stroke neurology service, continue aspirin and Plavix for 3 weeks and then after 3 weeks may drop the Plavix and treat with just aspirin after that, c/n  Lipitor 40 mg daily, LDL is 86, HDL is 34, A1c is 5.9, TSH is 0.8, UDS negative.  Hypercoagulability work-up is pending  2)Tobacco Abuse--- smoking cessation strongly advised , may use nicotine patch  3)Alcohol Abuse----continue MVI,  folic acid, no evidence of delirium tremens at this time  4)Possible Sleep Apnea--- Dr. Pearlean Brownie the stroke neurologist outpatient follow-up for formal sleep study and CPAP titration, as patient tested positive during  Sleep smart study overnight in the hospital  Code Status : Full  Family Communication:    none   Disposition Plan  : home   Consults  :  Stroke  Neurology   Discharge Condition: stable  Follow UP---- Dr Pearlean Brownie  Follow-up Information    [facility] Follow up.   Why:  Use this location for your pharmacy needs. Contact information: [address] [place] [address] [phone]          Diet and Activity recommendation:  As advised  Discharge Instructions    Discharge Instructions    Call MD for:  difficulty breathing, headache or visual disturbances   Complete by:  As directed    Call MD for:  persistant dizziness or light-headedness   Complete by:  As directed    Call MD for:  persistant nausea and vomiting   Complete by:  As directed    Call MD for:  temperature >100.4   Complete by:  As directed    Diet - low sodium heart healthy   Complete by:  As directed    Discharge instructions   Complete by:  As directed    1)Take Plavix 75 mg daily + Aspirin 81 mg with Food Daily For 3 weeks , then STOP Plavix and Take Aspirin only 2)Avoid ibuprofen/Advil/Aleve/Motrin/Goody Powders/Naproxen/BC powders/Meloxicam/Diclofenac/Indomethacin and other Nonsteroidal anti-inflammatory medications as these will make you more likely to bleed and can cause stomach ulcers, can also cause Kidney problems.  3) smoking cessation strongly advised 4) abstinence from alcohol strongly advised 5)Follow- up with Dr Pearlean Brownie to for stroke management and to discuss possibly getting a full sleep study and getting the CPAP machine at home   Increase activity slowly   Complete by:  As directed         Discharge Medications     Allergies as of 03/20/2018      Reactions   Tramadol Swelling   lips      Medication List    STOP taking these medications   famotidine 20 MG tablet Commonly known as:  PEPCID   pantoprazole 40 MG tablet Commonly known as:  PROTONIX     TAKE these medications   alum & mag hydroxide-simeth 400-400-40 MG/5ML suspension Commonly known as:  MAALOX PLUS Take 15  mLs by mouth as needed for indigestion.   aspirin EC 81 MG tablet Take 1 tablet (81 mg total) by mouth daily. Take Plavix 75 mg daily + Aspirin 81 mg with Food Daily For 3 weeks , then STOP Plavix and Take Aspirin only   atorvastatin 40 MG tablet Commonly known as:  LIPITOR Take 1 tablet (40 mg total) by mouth every evening.   bismuth subsalicylate 262 MG/15ML suspension Commonly known as:  PEPTO-BISMOL Take 30 mLs by mouth 4 (four) times daily -  before meals  and at bedtime.   cetirizine 10 MG tablet Commonly known as:  ZYRTEC Take 10 mg by mouth daily as needed for allergies.   clopidogrel 75 MG tablet Commonly known as:  PLAVIX Take 1 tablet (75 mg total) by mouth daily. Take Plavix 75 mg daily + Aspirin 81 mg with Food Daily For 3 weeks , then STOP Plavix and Take Aspirin only Start taking on:  March 21, 2018   fluticasone 50 MCG/ACT nasal spray Commonly known as:  FLONASE Place 2 sprays into both nostrils daily.   folic acid 1 MG tablet Commonly known as:  FOLVITE Take 1 tablet (1 mg total) by mouth daily. Start taking on:  March 21, 2018   hydroxypropyl methylcellulose / hypromellose 2.5 % ophthalmic solution Commonly known as:  ISOPTO TEARS / GONIOVISC Place 1 drop into both eyes as needed for dry eyes.   multivitamin with minerals Tabs tablet Take 1 tablet by mouth daily. Start taking on:  March 21, 2018   nicotine 14 mg/24hr patch Commonly known as:  NICODERM CQ - dosed in mg/24 hours Place 1 patch (14 mg total) onto the skin daily. Start taking on:  March 21, 2018   omeprazole 20 MG capsule Commonly known as:  PRILOSEC Take 1 capsule (20 mg total) by mouth daily.   ROLAIDS ADVANCED PO Take 3 tablets by mouth as needed (heartburn).   sucralfate 1 g tablet Commonly known as:  CARAFATE Take 1 tablet (1 g total) by mouth 4 (four) times daily -  with meals and at bedtime.   thiamine 100 MG tablet Take 1 tablet (100 mg total) by mouth daily. Start  taking on:  March 21, 2018       Major procedures and Radiology Reports - PLEASE review detailed and final reports for all details, in brief -    Ct Angio Head W Or Wo Contrast  Result Date: 03/18/2018 CLINICAL DATA:  Initial evaluation for acute headache, blurry vision. Found to have acute right PCA territory stroke. EXAM: CT ANGIOGRAPHY HEAD AND NECK TECHNIQUE: Multidetector CT imaging of the head and neck was performed using the standard protocol during bolus administration of intravenous contrast. Multiplanar CT image reconstructions and MIPs were obtained to evaluate the vascular anatomy. Carotid stenosis measurements (when applicable) are obtained utilizing NASCET criteria, using the distal internal carotid diameter as the denominator. CONTRAST:  80mL ISOVUE-370 IOPAMIDOL (ISOVUE-370) INJECTION 76% COMPARISON:  Prior CT and MRI from earlier the same day. FINDINGS: CTA NECK FINDINGS Aortic arch: Visualized aortic arch of normal caliber with normal branch pattern. Mild atheromatous plaque within the arch itself. No flow-limiting stenosis about the origin of the great vessels. Visualized subclavian arteries widely patent. Right carotid system: Right common and internal carotid arteries widely patent without stenosis, dissection, or occlusion. Left carotid system: Left common and internal carotid arteries widely patent without stenosis, dissection, or occlusion. Vertebral arteries: Both of the vertebral arteries arise from the subclavian arteries. Proximal right vertebral artery not well visualized due to adjacent venous contamination. Vertebral arteries otherwise widely patent within the neck without stenosis, dissection, or occlusion. Skeleton: No acute osseous abnormality. No discrete lytic or blastic osseous lesions. Scattered dental caries noted. Other neck: No other acute soft tissue abnormality within the neck. Upper chest: Visualized upper chest demonstrates no acute finding. Paraseptal and  centrilobular emphysematous changes noted. Review of the MIP images confirms the above findings CTA HEAD FINDINGS Anterior circulation: Internal carotid arteries widely patent to the termini without stenosis. A1 segments,  anterior communicating artery common anterior cerebral arteries widely patent. M1 segments widely patent bilaterally. Distal MCA branches well perfused and symmetric. Posterior circulation: Vertebral arteries widely patent to the vertebrobasilar junction without stenosis. Right PICA patent. Left PICA not well seen. Basilar widely patent to its distal aspect without stenosis. Superior cerebral arteries patent bilaterally. Posterior cerebral arteries primarily supplied via the basilar, although small bilateral posterior communicating arteries noted. PCAs are patent to their distal aspects without proximal occlusion or stenosis. Venous sinuses: Patent. Anatomic variants: None significant. Delayed phase: Evolving right PCA territory infarct. No other abnormal enhancement. Review of the MIP images confirms the above findings IMPRESSION: 1. Negative CTA of the head and neck. No large vessel occlusion. No hemodynamically significant stenosis or other acute vascular abnormality. 2. Emphysema. Electronically Signed   By: Rise Mu M.D.   On: 03/18/2018 00:00   Ct Head Wo Contrast  Result Date: 03/17/2018 CLINICAL DATA:  Headaches and bilateral blurred vision for 4 days. EXAM: CT HEAD WITHOUT CONTRAST TECHNIQUE: Contiguous axial images were obtained from the base of the skull through the vertex without intravenous contrast. COMPARISON:  None. FINDINGS: Brain: Focal poorly defined low-attenuation area with associated sulcal effacement in the right medial occipital lobe consistent with acute infarct in the distribution of the right posterior cerebral artery. Abscess or tumor would be a less likely consideration. MRI correlation is suggested. No acute intracranial hemorrhage. Otherwise, ventricles  and sulci are symmetrical. No abnormal extra-axial fluid collections. Gray-white matter junctions are distinct. Basal cisterns are not effaced. No ventricular dilatation. Vascular: No hyperdense vessel or unexpected calcification. Skull: Calvarium appears intact. Sinuses/Orbits: Paranasal sinuses and mastoid air cells are clear. Other: None. IMPRESSION: Acute infarct in the distribution of the right posterior cerebral artery. No acute intracranial hemorrhage. Electronically Signed   By: Burman Nieves M.D.   On: 03/17/2018 20:25   Ct Angio Neck W And/or Wo Contrast  Result Date: 03/18/2018 CLINICAL DATA:  Initial evaluation for acute headache, blurry vision. Found to have acute right PCA territory stroke. EXAM: CT ANGIOGRAPHY HEAD AND NECK TECHNIQUE: Multidetector CT imaging of the head and neck was performed using the standard protocol during bolus administration of intravenous contrast. Multiplanar CT image reconstructions and MIPs were obtained to evaluate the vascular anatomy. Carotid stenosis measurements (when applicable) are obtained utilizing NASCET criteria, using the distal internal carotid diameter as the denominator. CONTRAST:  80mL ISOVUE-370 IOPAMIDOL (ISOVUE-370) INJECTION 76% COMPARISON:  Prior CT and MRI from earlier the same day. FINDINGS: CTA NECK FINDINGS Aortic arch: Visualized aortic arch of normal caliber with normal branch pattern. Mild atheromatous plaque within the arch itself. No flow-limiting stenosis about the origin of the great vessels. Visualized subclavian arteries widely patent. Right carotid system: Right common and internal carotid arteries widely patent without stenosis, dissection, or occlusion. Left carotid system: Left common and internal carotid arteries widely patent without stenosis, dissection, or occlusion. Vertebral arteries: Both of the vertebral arteries arise from the subclavian arteries. Proximal right vertebral artery not well visualized due to adjacent venous  contamination. Vertebral arteries otherwise widely patent within the neck without stenosis, dissection, or occlusion. Skeleton: No acute osseous abnormality. No discrete lytic or blastic osseous lesions. Scattered dental caries noted. Other neck: No other acute soft tissue abnormality within the neck. Upper chest: Visualized upper chest demonstrates no acute finding. Paraseptal and centrilobular emphysematous changes noted. Review of the MIP images confirms the above findings CTA HEAD FINDINGS Anterior circulation: Internal carotid arteries widely patent to the termini without  stenosis. A1 segments, anterior communicating artery common anterior cerebral arteries widely patent. M1 segments widely patent bilaterally. Distal MCA branches well perfused and symmetric. Posterior circulation: Vertebral arteries widely patent to the vertebrobasilar junction without stenosis. Right PICA patent. Left PICA not well seen. Basilar widely patent to its distal aspect without stenosis. Superior cerebral arteries patent bilaterally. Posterior cerebral arteries primarily supplied via the basilar, although small bilateral posterior communicating arteries noted. PCAs are patent to their distal aspects without proximal occlusion or stenosis. Venous sinuses: Patent. Anatomic variants: None significant. Delayed phase: Evolving right PCA territory infarct. No other abnormal enhancement. Review of the MIP images confirms the above findings IMPRESSION: 1. Negative CTA of the head and neck. No large vessel occlusion. No hemodynamically significant stenosis or other acute vascular abnormality. 2. Emphysema. Electronically Signed   By: Rise MuBenjamin  McClintock M.D.   On: 03/18/2018 00:00   Mr Brain Wo Contrast  Result Date: 03/17/2018 CLINICAL DATA:  Follow-up examination for acute stroke. EXAM: MRI HEAD WITHOUT CONTRAST TECHNIQUE: Multiplanar, multiecho pulse sequences of the brain and surrounding structures were obtained without intravenous  contrast. COMPARISON:  Prior head CT from earlier same day. FINDINGS: Brain: Confluent diffusion abnormality involving the parasagittal cortical gray matter of the right temporal occipital region compatible with evolving right PCA territory infarct, largely acute in appearance. Localized gyral edema without significant regional mass effect. No associated hemorrhage. No other evidence for acute or subacute ischemia. Underlying cerebral volume is normal. No significant cerebral white matter changes. No mass lesion, midline shift or mass effect. No hydrocephalus. No extra-axial fluid collection. Pituitary gland suprasellar region normal. Midline structures intact and normal. Vascular: Major intracranial vascular flow voids are maintained. Skull and upper cervical spine: Craniocervical junction normal. Upper cervical spine within normal limits. Bone marrow signal intensity normal. No scalp soft tissue abnormality. Sinuses/Orbits: Globes and orbital soft tissues within normal limits. Paranasal sinuses are clear. No mastoid effusion. Inner ear structures normal. Other: None. IMPRESSION: 1. 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 No associated hemorrhage or significant regional mass effect. 2. Otherwise normal brain MRI. Electronically Signed   By: Rise MuBenjamin  McClintock M.D.   On: 03/17/2018 22:30   Vas [place] Lower Extremity Venous (dvt)  Result Date: 03/18/2018  Lower Venous Study Indications: Stroke.  Performing Technologist: Gertie FeyMichelle Simonetti MHA, RDMS, RVT, RDCS  Examination Guidelines: A complete evaluation includes B-mode imaging, spectral Doppler, color Doppler, and power Doppler as needed of all accessible portions of each vessel. Bilateral testing is considered an integral part of a complete examination. Limited examinations for reoccurring indications may be performed as noted.  Right Venous Findings: +---------+---------------+---------+-----------+----------+-------+           CompressibilityPhasicitySpontaneityPropertiesSummary +---------+---------------+---------+-----------+----------+-------+ CFV      Full           Yes      Yes                          +---------+---------------+---------+-----------+----------+-------+ SFJ      Full                                                 +---------+---------------+---------+-----------+----------+-------+ FV Prox  Full                                                 +---------+---------------+---------+-----------+----------+-------+  FV Mid   Full                                                 +---------+---------------+---------+-----------+----------+-------+ FV DistalFull                                                 +---------+---------------+---------+-----------+----------+-------+ PFV      Full                                                 +---------+---------------+---------+-----------+----------+-------+ POP      Full           Yes      Yes                          +---------+---------------+---------+-----------+----------+-------+ PTV      Full                                                 +---------+---------------+---------+-----------+----------+-------+ PERO     Full                                                 +---------+---------------+---------+-----------+----------+-------+  Left Venous Findings: +---------+---------------+---------+-----------+----------+-------+          CompressibilityPhasicitySpontaneityPropertiesSummary +---------+---------------+---------+-----------+----------+-------+ CFV      Full           Yes      Yes                          +---------+---------------+---------+-----------+----------+-------+ SFJ      Full                                                 +---------+---------------+---------+-----------+----------+-------+ FV Prox  Full                                                  +---------+---------------+---------+-----------+----------+-------+ FV Mid   Full                                                 +---------+---------------+---------+-----------+----------+-------+ FV DistalFull                                                 +---------+---------------+---------+-----------+----------+-------+  PFV      Full                                                 +---------+---------------+---------+-----------+----------+-------+ POP      Full           Yes      Yes                          +---------+---------------+---------+-----------+----------+-------+ PTV      Full                                                 +---------+---------------+---------+-----------+----------+-------+ PERO     Full                                                 +---------+---------------+---------+-----------+----------+-------+    Summary: Right: There is no evidence of deep vein thrombosis in the lower extremity. No cystic structure found in the popliteal fossa. Left: There is no evidence of deep vein thrombosis in the lower extremity. No cystic structure found in the popliteal fossa.  *See table(s) above for measurements and observations. Electronically signed by Gretta Began MD on 03/18/2018 at 9:42:49 PM.    Final    Today   Subjective    Tania Ade today has no new concerns, no chest pains no palpitation no dizziness, left eye visual disturbance persist,   Patient has been seen and examined prior to discharge   Objective   Blood pressure 119/84, pulse 66, temperature 98.3 F (36.8 C), temperature source Oral, resp. rate 16, height 5\' 9"  (1.753 m), weight 60 kg, SpO2 98 %.   Intake/Output Summary (Last 24 hours) at 03/20/2018 1134 Last data filed at 03/19/2018 1900 Gross per 24 hour  Intake 756 ml  Output -  Net 756 ml    Exam Gen:- Awake Alert,  In no apparent distress  HEENT:- [email or web address], No sclera icterus Eyes- visualfield testing  showed left upper quadrantanopsia Neck-Supple Neck,No JVD,.  Lungs-  CTAB , fair symmetrical air movement CV- S1, S2 normal, regular  Abd-  +ve B.Sounds, Abd Soft, No tenderness,    Extremity/Skin:- No  edema, pedal pulses present  Psych-affect is appropriate, oriented x3 Neuro-left eye visual disturbance, gait is steady   Data Review   CBC w Diff:  Lab Results  Component Value Date   WBC 6.2 03/17/2018   HGB 15.1 03/17/2018   HCT 43.9 03/17/2018   PLT 199 03/17/2018   LYMPHOPCT 33 03/17/2018   MONOPCT 7 03/17/2018   EOSPCT 2 03/17/2018   BASOPCT 1 03/17/2018    CMP:  Lab Results  Component Value Date   NA 139 03/19/2018   K 4.1 03/19/2018   CL 104 03/19/2018   CO2 24 03/19/2018   BUN 11 03/19/2018   CREATININE 0.90 03/19/2018   PROT 6.1 (L) 03/19/2018   ALBUMIN 3.6 03/19/2018   BILITOT 0.5 03/19/2018   ALKPHOS 59 03/19/2018   AST 23 03/19/2018   ALT 27 03/19/2018  .   Total Discharge time is  about 33 minutes  Shon Hale M.D on 03/20/2018 at 11:34 AM  Go to [email or web address] -  for contact info  [facility] - Office  [phone]

## 2018-03-20 NOTE — Discharge Instructions (Signed)
1)Take Plavix 75 mg daily + Aspirin 81 mg with Food Daily For 3 weeks , then STOP Plavix and Take Aspirin only 2)Avoid ibuprofen/Advil/Aleve/Motrin/Goody Powders/Naproxen/BC powders/Meloxicam/Diclofenac/Indomethacin and other Nonsteroidal anti-inflammatory medications as these will make you more likely to bleed and can cause stomach ulcers, can also cause Kidney problems.  3) smoking cessation strongly advised 4) abstinence from alcohol strongly advised 5)Follow- up with Dr Pearlean BrownieSethi to for stroke management and to discuss possibly getting a full sleep study and getting the CPAP machine at home

## 2018-03-20 NOTE — Care Management Note (Signed)
Case Management Note Previous Note Created by Jacqualin Combes   Patient Details  Name: Hector Wilcox MRN: [id number] Date of Birth: 1975-03-13  Subjective/Objective:      Pt admitted with stroke. He is from home with roommate.  DME: walker No PCP and no insurance No meds at home No issues with transportation.              Action/Plan: CM met with the patient and he is interested in being set up with one of the [facility]. Appt on the AVS. Pt is able to use [facility] pharmacy for assistance with his medications. CM following for d/c meds to see what assist he may need.   Expected Discharge Date:  03/20/18               Expected Discharge Plan:     In-House Referral:     Discharge planning Services  CM Consult, [facility]  Post Acute Care Choice:    Choice offered to:     DME Arranged:    DME Agency:     HH Arranged:    HH Agency:     Status of Service:  In process, will continue to follow  If discussed at Long Length of Stay Meetings, dates discussed:    Additional Comments: 03/20/2018 CM MATCHED pt, TOC to deliver meds bedside prior to discharge.  CM gave pt [facility] pharmacy address and requested pt to request ongoing medication assistance. Pt is aware of post discharge follow up appt.  CM also educated pt on goodrx benefit Maryclare Labrador, RN 03/20/2018, 11:30 AM

## 2018-03-26 NOTE — Progress Notes (Signed)
Transitions of Care Follow Up Call Note  Hector Wilcox is an 43 y.o. male who presented to [facility] on 03/17/2018.  The patient had the following prescriptions filled at [facility]: Sucralfate, thiamine, folic acid, atorvastatin, omeprazole  Patient unable to be reached after calling three times and prescriptions filled at the [facility] were transferred to preferred pharmacy found within their chart ([facility] on [address]).   Toniann Fail, PharmD 03/26/2018, 5:42 PM Transitions of Care Pharmacy Hours: Monday - Friday 8:30am to 5:00 PM  Phone - [phone]

## 2018-03-31 ENCOUNTER — Ambulatory Visit (INDEPENDENT_AMBULATORY_CARE_PROVIDER_SITE_OTHER): Payer: Self-pay | Admitting: Family Medicine

## 2018-03-31 ENCOUNTER — Encounter: Payer: Self-pay | Admitting: Family Medicine

## 2018-03-31 VITALS — BP 122/64 | HR 80 | Temp 97.8°F | Ht 69.0 in | Wt 177.0 lb

## 2018-03-31 DIAGNOSIS — Z7689 Persons encountering health services in other specified circumstances: Secondary | ICD-10-CM

## 2018-03-31 DIAGNOSIS — I63531 Cerebral infarction due to unspecified occlusion or stenosis of right posterior cerebral artery: Secondary | ICD-10-CM

## 2018-03-31 DIAGNOSIS — Z09 Encounter for follow-up examination after completed treatment for conditions other than malignant neoplasm: Secondary | ICD-10-CM

## 2018-03-31 DIAGNOSIS — Z130 Encounter for screening for diseases of the blood and blood-forming organs and certain disorders involving the immune mechanism: Secondary | ICD-10-CM

## 2018-03-31 DIAGNOSIS — Z0000 Encounter for general adult medical examination without abnormal findings: Secondary | ICD-10-CM

## 2018-03-31 LAB — POCT URINALYSIS DIP (MANUAL ENTRY)
Bilirubin, UA: NEGATIVE
Blood, UA: NEGATIVE
Glucose, UA: NEGATIVE mg/dL
Ketones, POC UA: NEGATIVE mg/dL
Leukocytes, UA: NEGATIVE
Nitrite, UA: NEGATIVE
Spec Grav, UA: 1.015 (ref 1.010–1.025)
Urobilinogen, UA: 1 E.U./dL
pH, UA: 7.5 (ref 5.0–8.0)

## 2018-03-31 NOTE — Progress Notes (Signed)
[facility]  Hospital Follow Up--New Patient--Establish Care  Subjective:  Patient ID: Hector Wilcox, male    DOB: 28-Oct-1975  Age: 43 y.o. MRN: [id number]  CC:  Chief Complaint  Patient presents with  . Establish Care  . Hospitalization Follow-up    Stroke   HPI Hector Wilcox is a 44 year male who presents to Hospital Follow Up and to Establish Care.   Past Medical History:  Diagnosis Date  . Acid reflux   . Hypocalcemia   . Sickle cell trait (HCC)     Current Status: Since his last office visit, he has had residual left sided visual disturbance since Stroke. He states that he began to have symptoms on 03/13/2018, but did not report to ED until 03/17/2018. He continues to have migraines, which he takes Aleve for relief. He has not had any headaches, visual changes, dizziness, and falls. No chest pain, heart palpitations, cough and shortness of breath reported. Patient states that he has no additional healthcare problems or concerns at this time. He is accompanied by his girlfriend today.   He denies fevers, chills, fatigue, recent infections, weight loss, and night sweats. No reports of GI problems such as nausea, vomiting, diarrhea, and constipation. He has no reports of blood in stools, dysuria and hematuria. No depression or anxiety, and denies suicidal ideations, homicidal ideations, or auditory hallucinations. He denies pain today.  Past Surgical History:  Procedure Laterality Date  . TEE WITHOUT CARDIOVERSION N/A 03/19/2018   Procedure: TRANSESOPHAGEAL ECHOCARDIOGRAM (TEE);  Surgeon: Lewayne Bunting, MD;  Location: [facility] ENDOSCOPY;  Service: Cardiovascular;  Laterality: N/A;    Family History  Problem Relation Age of Onset  . Heart failure Mother     Social History   Socioeconomic History  . Marital status: Single    Spouse name: Not on file  . Number of children: Not on file  . Years of education: Not on file  .  Highest education level: Not on file  Occupational History  . Not on file  Social Needs  . Financial resource strain: Not on file  . Food insecurity:    Worry: Not on file    Inability: Not on file  . Transportation needs:    Medical: Not on file    Non-medical: Not on file  Tobacco Use  . Smoking status: Former Smoker    Packs/day: 0.25    Years: 13.00    Pack years: 3.25    Types: Cigars  . Smokeless tobacco: Never Used  Substance and Sexual Activity  . Alcohol use: Yes    Alcohol/week: 4.0 standard drinks    Types: 4 Cans of beer per week  . Drug use: No  . Sexual activity: Yes    Birth control/protection: Condom  Lifestyle  . Physical activity:    Days per week: Not on file    Minutes per session: Not on file  . Stress: Not on file  Relationships  . Social connections:    Talks on phone: Not on file    Gets together: Not on file    Attends religious service: Not on file    Active member of club or organization: Not on file    Attends meetings of clubs or organizations: Not on file    Relationship status: Not on file  . Intimate partner violence:    Fear of current or ex partner: Not on file    Emotionally abused: Not  on file    Physically abused: Not on file    Forced sexual activity: Not on file  Other Topics Concern  . Not on file  Social History Narrative  . Not on file    Outpatient Medications Prior to Visit  Medication Sig Dispense Refill  . alum & mag hydroxide-simeth (MAALOX PLUS) 400-400-40 MG/5ML suspension Take 15 mLs by mouth as needed for indigestion.    Marland Kitchen. aspirin EC 81 MG tablet Take 1 tablet (81 mg total) by mouth daily. Take Plavix 75 mg daily + Aspirin 81 mg with Food Daily For 3 weeks , then STOP Plavix and Take Aspirin only 30 tablet 11  . atorvastatin (LIPITOR) 40 MG tablet Take 1 tablet (40 mg total) by mouth every evening. 30 tablet 5  . bismuth subsalicylate (PEPTO-BISMOL) 262 MG/15ML suspension Take 30 mLs by mouth 4 (four) times daily  -  before meals and at bedtime. 1700 mL 0  . Cal Carb-Mag Hydrox-Simeth (ROLAIDS ADVANCED PO) Take 3 tablets by mouth as needed (heartburn).    . clopidogrel (PLAVIX) 75 MG tablet Take 1 tablet (75 mg total) by mouth daily. Take Plavix 75 mg daily + Aspirin 81 mg with Food Daily For 3 weeks , then STOP Plavix and Take Aspirin only 21 tablet 0  . folic acid (FOLVITE) 1 MG tablet Take 1 tablet (1 mg total) by mouth daily. 30 tablet 5  . Multiple Vitamin (MULTIVITAMIN WITH MINERALS) TABS tablet Take 1 tablet by mouth daily. 30 tablet 5  . omeprazole (PRILOSEC) 20 MG capsule Take 1 capsule (20 mg total) by mouth daily. 30 capsule 1  . sucralfate (CARAFATE) 1 g tablet Take 1 tablet (1 g total) by mouth 4 (four) times daily -  with meals and at bedtime. 120 tablet 1  . thiamine 100 MG tablet Take 1 tablet (100 mg total) by mouth daily. 30 tablet 5  . cetirizine (ZYRTEC) 10 MG tablet Take 10 mg by mouth daily as needed for allergies.    . fluticasone (FLONASE) 50 MCG/ACT nasal spray Place 2 sprays into both nostrils daily. (Patient not taking: Reported on 07/10/2017) 1 g 0  . nicotine (NICODERM CQ - DOSED IN MG/24 HOURS) 14 mg/24hr patch Place 1 patch (14 mg total) onto the skin daily. (Patient not taking: Reported on 03/31/2018) 28 patch 0  . hydroxypropyl methylcellulose / hypromellose (ISOPTO TEARS / GONIOVISC) 2.5 % ophthalmic solution Place 1 drop into both eyes as needed for dry eyes.     No facility-administered medications prior to visit.     Allergies  Allergen Reactions  . Tramadol Swelling    lips   ROS Review of Systems  Constitutional: Negative.   HENT: Negative.   Eyes: Positive for visual disturbance (Left sided).  Respiratory: Negative.   Cardiovascular: Negative.   Gastrointestinal: Negative.   Endocrine: Negative.   Genitourinary: Negative.   Musculoskeletal: Negative.   Skin: Negative.   Allergic/Immunologic: Negative.   Neurological: Negative.   Hematological: Negative.    Psychiatric/Behavioral: Negative.    Objective:    Physical Exam  Constitutional: He is oriented to person, place, and time. He appears well-developed and well-nourished.  HENT:  Head: Normocephalic and atraumatic.  Eyes: Conjunctivae are normal.  Neck: Normal range of motion. Neck supple.  Cardiovascular: Normal rate and regular rhythm.  Pulmonary/Chest: Effort normal and breath sounds normal.  Abdominal: Soft. Bowel sounds are normal.  Musculoskeletal: Normal range of motion.  Neurological: He is alert and oriented to person, place,  and time. He has normal reflexes.  Skin: Skin is warm and dry.  Psychiatric: He has a normal mood and affect. His behavior is normal. Judgment and thought content normal.  Vitals reviewed.  BP 122/64 (BP Location: Left Arm, Patient Position: Sitting, Cuff Size: Small)   Pulse 80   Temp 97.8 F (36.6 C) (Oral)   Ht 5\' 9"  (1.753 m)   Wt 177 lb (80.3 kg)   SpO2 98%   BMI 26.14 kg/m  Wt Readings from Last 3 Encounters:  03/31/18 177 lb (80.3 kg)  03/18/18 132 lb 4.4 oz (60 kg)  07/10/17 179 lb (81.2 kg)    Health Maintenance Due  Topic Date Due  . TETANUS/TDAP  09/11/1996  . INFLUENZA VACCINE  09/11/2017    There are no preventive care reminders to display for this patient.  Lab Results  Component Value Date   TSH 0.895 03/19/2018   Lab Results  Component Value Date   WBC 6.2 03/17/2018   HGB 15.1 03/17/2018   HCT 43.9 03/17/2018   MCV 88.3 03/17/2018   PLT 199 03/17/2018   Lab Results  Component Value Date   NA 139 03/19/2018   K 4.1 03/19/2018   CO2 24 03/19/2018   GLUCOSE 109 (H) 03/19/2018   BUN 11 03/19/2018   CREATININE 0.90 03/19/2018   BILITOT 0.5 03/19/2018   ALKPHOS 59 03/19/2018   AST 23 03/19/2018   ALT 27 03/19/2018   PROT 6.1 (L) 03/19/2018   ALBUMIN 3.6 03/19/2018   CALCIUM 8.8 (L) 03/19/2018   ANIONGAP 11 03/19/2018   Lab Results  Component Value Date   CHOL 127 03/18/2018   Lab Results  Component  Value Date   HDL 34 (L) 03/18/2018   Lab Results  Component Value Date   LDLCALC 86 03/18/2018   Lab Results  Component Value Date   TRIG 34 03/18/2018   Lab Results  Component Value Date   CHOLHDL 3.7 03/18/2018   Lab Results  Component Value Date   HGBA1C 5.9 (H) 03/18/2018    Assessment & Plan:    1. Hospital discharge follow-up  2. Encounter to establish care - POCT urinalysis dipstick  3. Acute right PCA stroke [facility]) He is doing well. No signs and symptoms of recurrence noted. Left eye visual distubance residual side effect noted.  - Ambulatory referral to Neurology  4. Screening for sickle-cell disease or trait Positive Sickle Anemia Screening on 03/19/2018. We will draw Hemoglobinopathy for further evaluation.  - Hemoglobinopathy evaluation; Future  5. Healthcare maintenance - CBC with Differential; Future - Comprehensive metabolic panel; Future - Lipid Panel; Future - TSH; Future  6. Follow up He will follow up in 1 month.   No orders of the defined types were placed in this encounter.  Orders Placed This Encounter  Procedures  . CBC with Differential  . Comprehensive metabolic panel  . Lipid Panel  . TSH  . Hemoglobinopathy evaluation  . Ambulatory referral to Neurology  . POCT urinalysis dipstick    Referral Orders     Ambulatory referral to Neurology   Raliegh Ip,  MSN, FNP-C [facility] [address] [place], [place] [address] [phone]   Problem List Items Addressed This Visit      Cardiovascular and Mediastinum   Acute right PCA stroke [facility])   Relevant Orders   Ambulatory referral to Neurology    Other Visit Diagnoses    Hospital discharge follow-up    -  Primary   Encounter to establish care       Relevant Orders   POCT urinalysis dipstick (Completed)   Screening for sickle-cell disease or trait       Relevant Orders   Hemoglobinopathy evaluation   Healthcare maintenance        Relevant Orders   CBC with Differential   Comprehensive metabolic panel   Lipid Panel   TSH   Follow up          No orders of the defined types were placed in this encounter.   Follow-up: Return in about 1 month (around 04/29/2018).    Kallie Locks, FNP

## 2018-04-01 ENCOUNTER — Encounter: Payer: Self-pay | Admitting: Family Medicine

## 2018-04-01 DIAGNOSIS — Z130 Encounter for screening for diseases of the blood and blood-forming organs and certain disorders involving the immune mechanism: Secondary | ICD-10-CM | POA: Insufficient documentation

## 2018-04-01 NOTE — Progress Notes (Signed)
Initial appointment with patient on 03/31/2018. Orders placed for additional labs today. Nurse to schedule patient for Lab only appointment asap.

## 2018-04-02 ENCOUNTER — Telehealth: Payer: Self-pay

## 2018-04-02 NOTE — Telephone Encounter (Signed)
-----   Message from Kallie Locks, FNP sent at 04/01/2018  8:15 PM EST ----- Regarding: "Labs" Orders placed for additional labs. Please schedule patient for Lab only appointment asap. Patient not to eat prior to lab draw. We will contact patient with lab results. Keep follow up appointment.

## 2018-04-02 NOTE — Telephone Encounter (Signed)
Patient scheduled.

## 2018-04-07 ENCOUNTER — Other Ambulatory Visit: Payer: Self-pay

## 2018-04-07 DIAGNOSIS — Z130 Encounter for screening for diseases of the blood and blood-forming organs and certain disorders involving the immune mechanism: Secondary | ICD-10-CM

## 2018-04-07 DIAGNOSIS — Z0000 Encounter for general adult medical examination without abnormal findings: Secondary | ICD-10-CM

## 2018-04-09 LAB — CBC WITH DIFFERENTIAL/PLATELET
Basophils Absolute: 0 10*3/uL (ref 0.0–0.2)
Basos: 1 %
EOS (ABSOLUTE): 0.1 10*3/uL (ref 0.0–0.4)
Eos: 2 %
Hematocrit: 41.5 % (ref 37.5–51.0)
Hemoglobin: 14.9 g/dL (ref 13.0–17.7)
Immature Grans (Abs): 0 10*3/uL (ref 0.0–0.1)
Immature Granulocytes: 1 %
Lymphocytes Absolute: 1.8 10*3/uL (ref 0.7–3.1)
Lymphs: 32 %
MCH: 30.4 pg (ref 26.6–33.0)
MCHC: 35.9 g/dL — ABNORMAL HIGH (ref 31.5–35.7)
MCV: 85 fL (ref 79–97)
Monocytes Absolute: 0.7 10*3/uL (ref 0.1–0.9)
Monocytes: 12 %
Neutrophils Absolute: 3 10*3/uL (ref 1.4–7.0)
Neutrophils: 52 %
Platelets: 257 10*3/uL (ref 150–450)
RBC: 4.9 x10E6/uL (ref 4.14–5.80)
RDW: 12.2 % (ref 11.6–15.4)
WBC: 5.7 10*3/uL (ref 3.4–10.8)

## 2018-04-09 LAB — HEMOGLOBINOPATHY EVALUATION
HGB C: 0 %
HGB S: 39.2 % — ABNORMAL HIGH
HGB VARIANT: 0 %
Hemoglobin A2 Quantitation: 4 % — ABNORMAL HIGH (ref 1.8–3.2)
Hemoglobin F Quantitation: 0 % (ref 0.0–2.0)
Hgb A: 56.8 % — ABNORMAL LOW (ref 96.4–98.8)

## 2018-04-09 LAB — COMPREHENSIVE METABOLIC PANEL
ALT: 40 IU/L (ref 0–44)
AST: 23 IU/L (ref 0–40)
Albumin/Globulin Ratio: 2.6 — ABNORMAL HIGH (ref 1.2–2.2)
Albumin: 4.7 g/dL (ref 4.0–5.0)
Alkaline Phosphatase: 79 IU/L (ref 39–117)
BUN/Creatinine Ratio: 9 (ref 9–20)
BUN: 8 mg/dL (ref 6–24)
Bilirubin Total: 0.5 mg/dL (ref 0.0–1.2)
CO2: 22 mmol/L (ref 20–29)
Calcium: 9.3 mg/dL (ref 8.7–10.2)
Chloride: 104 mmol/L (ref 96–106)
Creatinine, Ser: 0.94 mg/dL (ref 0.76–1.27)
GFR calc Af Amer: 117 mL/min/{1.73_m2} (ref >59)
GFR calc non Af Amer: 101 mL/min/{1.73_m2} (ref >59)
Globulin, Total: 1.8 g/dL (ref 1.5–4.5)
Glucose: 103 mg/dL — ABNORMAL HIGH (ref 65–99)
Potassium: 4 mmol/L (ref 3.5–5.2)
Sodium: 143 mmol/L (ref 134–144)
Total Protein: 6.5 g/dL (ref 6.0–8.5)

## 2018-04-09 LAB — LIPID PANEL
Chol/HDL Ratio: 2.5 ratio (ref 0.0–5.0)
Cholesterol, Total: 84 mg/dL — ABNORMAL LOW (ref 100–199)
HDL: 34 mg/dL — ABNORMAL LOW (ref >39)
LDL Calculated: 41 mg/dL (ref 0–99)
Triglycerides: 46 mg/dL (ref 0–149)
VLDL Cholesterol Cal: 9 mg/dL (ref 5–40)

## 2018-04-09 LAB — TSH: TSH: 1.57 u[IU]/mL (ref 0.450–4.500)

## 2018-04-15 ENCOUNTER — Encounter: Payer: Self-pay | Admitting: Family Medicine

## 2018-04-20 MED FILL — SUCRALFATE 1 GM TABLET: 1 | 30 days supply | Qty: 120 | Fill #0

## 2018-04-20 MED FILL — ATORVASTATIN CALCIUM 40 MG: 40 | 30 days supply | Qty: 30 | Fill #0

## 2018-04-20 MED FILL — OMEPRAZOLE 20 MG CAP: 20 | 30 days supply | Qty: 30 | Fill #0

## 2018-04-20 MED FILL — FOLIC ACID 1 MG TABS: 1 | 30 days supply | Qty: 30 | Fill #0

## 2018-04-29 ENCOUNTER — Encounter: Payer: Self-pay | Admitting: Family Medicine

## 2018-04-29 ENCOUNTER — Other Ambulatory Visit: Payer: Self-pay

## 2018-04-29 ENCOUNTER — Ambulatory Visit (INDEPENDENT_AMBULATORY_CARE_PROVIDER_SITE_OTHER): Payer: Self-pay | Admitting: Family Medicine

## 2018-04-29 VITALS — BP 126/72 | HR 84 | Temp 98.4°F | Ht 69.0 in | Wt 186.0 lb

## 2018-04-29 DIAGNOSIS — K219 Gastro-esophageal reflux disease without esophagitis: Secondary | ICD-10-CM | POA: Insufficient documentation

## 2018-04-29 DIAGNOSIS — N529 Male erectile dysfunction, unspecified: Secondary | ICD-10-CM | POA: Insufficient documentation

## 2018-04-29 DIAGNOSIS — R5383 Other fatigue: Secondary | ICD-10-CM | POA: Insufficient documentation

## 2018-04-29 DIAGNOSIS — Z09 Encounter for follow-up examination after completed treatment for conditions other than malignant neoplasm: Secondary | ICD-10-CM

## 2018-04-29 DIAGNOSIS — I63531 Cerebral infarction due to unspecified occlusion or stenosis of right posterior cerebral artery: Secondary | ICD-10-CM

## 2018-04-29 DIAGNOSIS — R42 Dizziness and giddiness: Secondary | ICD-10-CM | POA: Insufficient documentation

## 2018-04-29 DIAGNOSIS — R531 Weakness: Secondary | ICD-10-CM | POA: Insufficient documentation

## 2018-04-29 MED ORDER — SILDENAFIL CITRATE 50 MG PO TABS: 50.0000 mg | ORAL_TABLET | Freq: Every day | ORAL | 0 refills | Status: DC | PRN

## 2018-04-29 MED ORDER — PANTOPRAZOLE SODIUM 40 MG PO TBEC: 40.0000 mg | DELAYED_RELEASE_TABLET | Freq: Every day | ORAL | 3 refills | Status: DC

## 2018-04-29 MED ORDER — MECLIZINE HCL 25 MG PO TABS: 25.0000 mg | ORAL_TABLET | Freq: Three times a day (TID) | ORAL | 2 refills | Status: DC | PRN

## 2018-04-29 MED FILL — SILDENAFIL CITRATE 50 MG TA: 50 | 30 days supply | Qty: 10 | Fill #0

## 2018-04-29 MED FILL — PANTOPRAZOLE SOD DR 40 MG T: 40 | 30 days supply | Qty: 30 | Fill #0

## 2018-04-29 MED FILL — MECLIZINE 25 MG TABLET: 25 | 10 days supply | Qty: 30 | Fill #0

## 2018-04-29 NOTE — Patient Instructions (Addendum)
Erectile Dysfunction Erectile dysfunction (ED) is the inability to get or keep an erection in order to have sexual intercourse. Erectile dysfunction may include:  Inability to get an erection.  Lack of enough hardness of the erection to allow penetration.  Loss of the erection before sex is finished. What are the causes? This condition may be caused by:  Certain medicines, such as: ? Pain relievers. ? Antihistamines. ? Antidepressants. ? Blood pressure medicines. ? Water pills (diuretics). ? Ulcer medicines. ? Muscle relaxants. ? Drugs.  Excessive drinking.  Psychological causes, such as: ? Anxiety. ? Depression. ? Sadness. ? Exhaustion. ? Performance fear. ? Stress.  Physical causes, such as: ? Artery problems. This may include diabetes, smoking, liver disease, or atherosclerosis. ? High blood pressure. ? Hormonal problems, such as low testosterone. ? Obesity. ? Nerve problems. This may include back or pelvic injuries, diabetes mellitus, multiple sclerosis, or Parkinson disease. What are the signs or symptoms? Symptoms of this condition include:  Inability to get an erection.  Lack of enough hardness of the erection to allow penetration.  Loss of the erection before sex is finished.  Normal erections at some times, but with frequent unsatisfactory episodes.  Low sexual satisfaction in either partner due to erection problems.  A curved penis occurring with erection. The curve may cause pain or the penis may be too curved to allow for intercourse.  Never having nighttime erections. How is this diagnosed? This condition is often diagnosed by:  Performing a physical exam to find other diseases or specific problems with the penis.  Asking you detailed questions about the problem.  Performing blood tests to check for diabetes mellitus or to measure hormone levels.  Performing other tests to check for underlying health conditions.  Performing an ultrasound  exam to check for scarring.  Performing a test to check blood flow to the penis.  Doing a sleep study at home to measure nighttime erections. How is this treated? This condition may be treated by:  Medicine taken by mouth to help you achieve an erection (oral medicine).  Hormone replacement therapy to replace low testosterone levels.  Medicine that is injected into the penis. Your health care provider may instruct you how to give yourself these injections at home.  Vacuum pump. This is a pump with a ring on it. The pump and ring are placed on the penis and used to create pressure that helps the penis become erect.  Penile implant surgery. In this procedure, you may receive: ? An inflatable implant. This consists of cylinders, a pump, and a reservoir. The cylinders can be inflated with a fluid that helps to create an erection, and they can be deflated after intercourse. ? A semi-rigid implant. This consists of two silicone rubber rods. The rods provide some rigidity. They are also flexible, so the penis can both curve downward in its normal position and become straight for sexual intercourse.  Blood vessel surgery, to improve blood flow to the penis. During this procedure, a blood vessel from a different part of the body is placed into the penis to allow blood to flow around (bypass) damaged or blocked blood vessels.  Lifestyle changes, such as exercising more, losing weight, and quitting smoking. Follow these instructions at home: Medicines   Take over-the-counter and prescription medicines only as told by your health care provider. Do not increase the dosage without first discussing it with your health care provider.  If you are using self-injections, perform injections as directed by your   health care provider. Make sure to avoid any veins that are on the surface of the penis. After giving an injection, apply pressure to the injection site for 5 minutes. General instructions   Exercise regularly, as directed by your health care provider. Work with your health care provider to lose weight, if needed.  Do not use any products that contain nicotine or tobacco, such as cigarettes and e-cigarettes. If you need help quitting, ask your health care provider.  Before using a vacuum pump, read the instructions that come with the pump and discuss any questions with your health care provider.  Keep all follow-up visits as told by your health care provider. This is important. Contact a health care provider if:  You feel nauseous.  You vomit. Get help right away if:  You are taking oral or injectable medicines and you have an erection that lasts longer than 4 hours. If your health care provider is unavailable, go to the nearest emergency room for evaluation. An erection that lasts much longer than 4 hours can result in permanent damage to your penis.  You have severe pain in your groin or abdomen.  You develop redness or severe swelling of your penis.  You have redness spreading up into your groin or lower abdomen.  You are unable to urinate.  You experience chest pain or a rapid heart beat (palpitations) after taking oral medicines. Summary  Erectile dysfunction (ED) is the inability to get or keep an erection during sexual intercourse. This problem can usually be treated successfully.  This condition is diagnosed based on a physical exam, your symptoms, and tests to determine the cause. Treatment varies depending on the cause, and may include medicines, hormone therapy, surgery, or vacuum pump.  You may need follow-up visits to make sure that you are using your medicines or devices correctly.  Get help right away if you are taking or injecting medicines and you have an erection that lasts longer than 4 hours. This information is not intended to replace advice given to you by your health care provider. Make sure you discuss any questions you have with your health care  provider. Document Released: 01/26/2000 Document Revised: 02/14/2016 Document Reviewed: 02/14/2016 Elsevier Interactive Patient Education  2019 [employer]     Sildenafil tablets (Erectile Dysfunction) What is this medicine? SILDENAFIL (sil DEN a fil) is used to treat erection problems in men. This medicine may be used for other purposes; ask your health care provider or pharmacist if you have questions. COMMON BRAND NAME(S): Viagra What should I tell my health care provider before I take this medicine? They need to know if you have any of these conditions: -bleeding disorders -eye or vision problems, including a rare inherited eye disease called retinitis pigmentosa -anatomical deformation of the penis, Peyronie's disease, or history of priapism (painful and prolonged erection) -heart disease, angina, a history of heart attack, irregular heart beats, or other heart problems -high or low blood pressure -history of blood diseases, like sickle cell anemia or leukemia -history of stomach bleeding -kidney disease -liver disease -stroke -an unusual or allergic reaction to sildenafil, other medicines, foods, dyes, or preservatives -pregnant or trying to get pregnant -breast-feeding How should I use this medicine? Take this medicine by mouth with a glass of water. Follow the directions on the prescription label. The dose is usually taken 1 hour before sexual activity. You should not take the dose more than once per day. Do not take your medicine more often than directed.  Talk to your pediatrician regarding the use of this medicine in children. This medicine is not used in children for this condition. Overdosage: If you think you have taken too much of this medicine contact a poison control center or emergency room at once. NOTE: This medicine is only for you. Do not share this medicine with others. What if I miss a dose? This does not apply. Do not take double or extra doses. What may  interact with this medicine? Do not take this medicine with any of the following medications: -cisapride -nitrates like amyl nitrite, isosorbide dinitrate, isosorbide mononitrate, nitroglycerin -riociguat This medicine may also interact with the following medications: -antiviral medicines for HIV or AIDS -bosentan -certain medicines for benign prostatic hyperplasia (BPH) -certain medicines for blood pressure -certain medicines for fungal infections like ketoconazole and itraconazole -cimetidine -erythromycin -rifampin This list may not describe all possible interactions. Give your health care provider a list of all the medicines, herbs, non-prescription drugs, or dietary supplements you use. Also tell them if you smoke, drink alcohol, or use illegal drugs. Some items may interact with your medicine. What should I watch for while using this medicine? If you notice any changes in your vision while taking this drug, call your doctor or health care professional as soon as possible. Stop using this medicine and call your health care provider right away if you have a loss of sight in one or both eyes. Contact your doctor or health care professional right away if you have an erection that lasts longer than 4 hours or if it becomes painful. This may be a sign of a serious problem and must be treated right away to prevent permanent damage. If you experience symptoms of nausea, dizziness, chest pain or arm pain upon initiation of sexual activity after taking this medicine, you should refrain from further activity and call your doctor or health care professional as soon as possible. Do not drink alcohol to excess (examples, 5 glasses of wine or 5 shots of whiskey) when taking this medicine. When taken in excess, alcohol can increase your chances of getting a headache or getting dizzy, increasing your heart rate or lowering your blood pressure. Using this medicine does not protect you or your partner against  HIV infection (the virus that causes AIDS) or other sexually transmitted diseases. What side effects may I notice from receiving this medicine? Side effects that you should report to your doctor or health care professional as soon as possible: -allergic reactions like skin rash, itching or hives, swelling of the face, lips, or tongue -breathing problems -changes in hearing -changes in vision -chest pain -fast, irregular heartbeat -prolonged or painful erection -seizures Side effects that usually do not require medical attention (report to your doctor or health care professional if they continue or are bothersome): -back pain -dizziness -flushing -headache -indigestion -muscle aches -nausea -stuffy or runny nose This list may not describe all possible side effects. Call your doctor for medical advice about side effects. You may report side effects to FDA at 1-800-FDA-1088. Where should I keep my medicine? Keep out of reach of children. Store at room temperature between 15 and 30 degrees C (59 and 86 degrees F). Throw away any unused medicine after the expiration date. NOTE: This sheet is a summary. It may not cover all possible information. If you have questions about this medicine, talk to your doctor, pharmacist, or health care provider.  2019 Elsevier/Gold Standard (2015-01-11 12:00:25)    Insomnia Insomnia is a sleep  disorder that makes it difficult to fall asleep or stay asleep. Insomnia can cause fatigue, low energy, difficulty concentrating, mood swings, and poor performance at work or school. There are three different ways to classify insomnia:  Difficulty falling asleep.  Difficulty staying asleep.  Waking up too early in the morning. Any type of insomnia can be long-term (chronic) or short-term (acute). Both are common. Short-term insomnia usually lasts for three months or less. Chronic insomnia occurs at least three times a week for longer than three months. What are  the causes? Insomnia may be caused by another condition, situation, or substance, such as:  Anxiety.  Certain medicines.  Gastroesophageal reflux disease (GERD) or other gastrointestinal conditions.  Asthma or other breathing conditions.  Restless legs syndrome, sleep apnea, or other sleep disorders.  Chronic pain.  Menopause.  Stroke.  Abuse of alcohol, tobacco, or illegal drugs.  Mental health conditions, such as depression.  Caffeine.  Neurological disorders, such as Alzheimer's disease.  An overactive thyroid (hyperthyroidism). Sometimes, the cause of insomnia may not be known. What increases the risk? Risk factors for insomnia include:  Gender. Women are affected more often than men.  Age. Insomnia is more common as you get older.  Stress.  Lack of exercise.  Irregular work schedule or working night shifts.  Traveling between different time zones.  Certain medical and mental health conditions. What are the signs or symptoms? If you have insomnia, the main symptom is having trouble falling asleep or having trouble staying asleep. This may lead to other symptoms, such as:  Feeling fatigued or having low energy.  Feeling nervous about going to sleep.  Not feeling rested in the morning.  Having trouble concentrating.  Feeling irritable, anxious, or depressed. How is this diagnosed? This condition may be diagnosed based on:  Your symptoms and medical history. Your health care provider may ask about: ? Your sleep habits. ? Any medical conditions you have. ? Your mental health.  A physical exam. How is this treated? Treatment for insomnia depends on the cause. Treatment may focus on treating an underlying condition that is causing insomnia. Treatment may also include:  Medicines to help you sleep.  Counseling or therapy.  Lifestyle adjustments to help you sleep better. Follow these instructions at home: Eating and drinking   Limit or avoid  alcohol, caffeinated beverages, and cigarettes, especially close to bedtime. These can disrupt your sleep.  Do not eat a large meal or eat spicy foods right before bedtime. This can lead to digestive discomfort that can make it hard for you to sleep. Sleep habits   Keep a sleep diary to help you and your health care provider figure out what could be causing your insomnia. Write down: ? When you sleep. ? When you wake up during the night. ? How well you sleep. ? How rested you feel the next day. ? Any side effects of medicines you are taking. ? What you eat and drink.  Make your bedroom a dark, comfortable place where it is easy to fall asleep. ? Put up shades or blackout curtains to block light from outside. ? Use a white noise machine to block noise. ? Keep the temperature cool.  Limit screen use before bedtime. This includes: ? Watching TV. ? Using your smartphone, tablet, or computer.  Stick to a routine that includes going to bed and waking up at the same times every day and night. This can help you fall asleep faster. Consider making a quiet activity,  such as reading, part of your nighttime routine.  Try to avoid taking naps during the day so that you sleep better at night.  Get out of bed if you are still awake after 15 minutes of trying to sleep. Keep the lights down, but try reading or doing a quiet activity. When you feel sleepy, go back to bed. General instructions  Take over-the-counter and prescription medicines only as told by your health care provider.  Exercise regularly, as told by your health care provider. Avoid exercise starting several hours before bedtime.  Use relaxation techniques to manage stress. Ask your health care provider to suggest some techniques that may work well for you. These may include: ? Breathing exercises. ? Routines to release muscle tension. ? Visualizing peaceful scenes.  Make sure that you drive carefully. Avoid driving if you feel  very sleepy.  Keep all follow-up visits as told by your health care provider. This is important. Contact a health care provider if:  You are tired throughout the day.  You have trouble in your daily routine due to sleepiness.  You continue to have sleep problems, or your sleep problems get worse. Get help right away if:  You have serious thoughts about hurting yourself or someone else. If you ever feel like you may hurt yourself or others, or have thoughts about taking your own life, get help right away. You can go to your nearest emergency department or call:  Your local emergency services (911 in the [place]  A suicide crisis helpline, such as the National Suicide Prevention Lifeline at 904-663-6832. This is open 24 hours a day. Summary  Insomnia is a sleep disorder that makes it difficult to fall asleep or stay asleep.  Insomnia can be long-term (chronic) or short-term (acute).  Treatment for insomnia depends on the cause. Treatment may focus on treating an underlying condition that is causing insomnia.  Keep a sleep diary to help you and your health care provider figure out what could be causing your insomnia. This information is not intended to replace advice given to you by your health care provider. Make sure you discuss any questions you have with your health care provider. Document Released: 01/26/2000 Document Revised: 11/07/2016 Document Reviewed: 11/07/2016 Elsevier Interactive Patient Education  2019 [employer].

## 2018-04-29 NOTE — Progress Notes (Signed)
[facility]  Established Patient Office Visit  Subjective:  Patient ID: Hector Wilcox, male    DOB: 1975-07-09  Age: 43 y.o. MRN: [id number]  CC:  Chief Complaint  Patient presents with  . Follow-up    1 month on chronic condition   . Fatigue  . Dizziness    HPI Hector Wilcox is a 43 year old male who presents for Follow Up today.   Past Medical History:  Diagnosis Date  . Acid reflux   . Hypocalcemia   . Sickle cell trait (HCC)    Current Status: Since his last office visit, he is doing well with no complaints. He continues to have left-sided visual problems. He reports increase fatigue since his Stroke. Left sided weakness. His migraines are stable today. He continues to follow up with Neurology.   He denies fevers, chills, fatigue, recent infections, weight loss, and night sweats. He has not had any headache and falls. No chest pain, heart palpitations, cough and shortness of breath reported. No reports of GI problems such as nausea, vomiting, diarrhea, and constipation. He has no reports of blood in stools, dysuria and hematuria. No depression or anxiety reported.  Past Surgical History:  Procedure Laterality Date  . TEE WITHOUT CARDIOVERSION N/A 03/19/2018   Procedure: TRANSESOPHAGEAL ECHOCARDIOGRAM (TEE);  Surgeon: Lewayne Bunting, MD;  Location: [facility] ENDOSCOPY;  Service: Cardiovascular;  Laterality: N/A;    Family History  Problem Relation Age of Onset  . Heart failure Mother     Social History   Socioeconomic History  . Marital status: Single    Spouse name: Not on file  . Number of children: Not on file  . Years of education: Not on file  . Highest education level: Not on file  Occupational History  . Not on file  Social Needs  . Financial resource strain: Not on file  . Food insecurity:    Worry: Not on file    Inability: Not on file  . Transportation needs:    Medical: Not on file    Non-medical: Not  on file  Tobacco Use  . Smoking status: Former Smoker    Packs/day: 0.25    Years: 13.00    Pack years: 3.25    Types: Cigars  . Smokeless tobacco: Never Used  Substance and Sexual Activity  . Alcohol use: Yes    Alcohol/week: 4.0 standard drinks    Types: 4 Cans of beer per week  . Drug use: No  . Sexual activity: Yes    Birth control/protection: Condom  Lifestyle  . Physical activity:    Days per week: Not on file    Minutes per session: Not on file  . Stress: Not on file  Relationships  . Social connections:    Talks on phone: Not on file    Gets together: Not on file    Attends religious service: Not on file    Active member of club or organization: Not on file    Attends meetings of clubs or organizations: Not on file    Relationship status: Not on file  . Intimate partner violence:    Fear of current or ex partner: Not on file    Emotionally abused: Not on file    Physically abused: Not on file    Forced sexual activity: Not on file  Other Topics Concern  . Not on file  Social History Narrative  . Not on file  Outpatient Medications Prior to Visit  Medication Sig Dispense Refill  . alum & mag hydroxide-simeth (MAALOX PLUS) 400-400-40 MG/5ML suspension Take 15 mLs by mouth as needed for indigestion.    Marland Kitchen aspirin EC 81 MG tablet Take 1 tablet (81 mg total) by mouth daily. Take Plavix 75 mg daily + Aspirin 81 mg with Food Daily For 3 weeks , then STOP Plavix and Take Aspirin only 30 tablet 11  . atorvastatin (LIPITOR) 40 MG tablet Take 1 tablet (40 mg total) by mouth every evening. 30 tablet 5  . bismuth subsalicylate (PEPTO-BISMOL) 262 MG/15ML suspension Take 30 mLs by mouth 4 (four) times daily -  before meals and at bedtime. 1700 mL 0  . Cal Carb-Mag Hydrox-Simeth (ROLAIDS ADVANCED PO) Take 3 tablets by mouth as needed (heartburn).    . cetirizine (ZYRTEC) 10 MG tablet Take 10 mg by mouth daily as needed for allergies.    Marland Kitchen clopidogrel (PLAVIX) 75 MG tablet Take  1 tablet (75 mg total) by mouth daily. Take Plavix 75 mg daily + Aspirin 81 mg with Food Daily For 3 weeks , then STOP Plavix and Take Aspirin only 21 tablet 0  . fluticasone (FLONASE) 50 MCG/ACT nasal spray Place 2 sprays into both nostrils daily. 1 g 0  . folic acid (FOLVITE) 1 MG tablet Take 1 tablet (1 mg total) by mouth daily. 30 tablet 5  . Melatonin 1 MG TABS Take 1 mg by mouth.    . Multiple Vitamin (MULTIVITAMIN WITH MINERALS) TABS tablet Take 1 tablet by mouth daily. 30 tablet 5  . sucralfate (CARAFATE) 1 g tablet Take 1 tablet (1 g total) by mouth 4 (four) times daily -  with meals and at bedtime. 120 tablet 1  . thiamine 100 MG tablet Take 1 tablet (100 mg total) by mouth daily. 30 tablet 5  . omeprazole (PRILOSEC) 20 MG capsule Take 1 capsule (20 mg total) by mouth daily. 30 capsule 1  . nicotine (NICODERM CQ - DOSED IN MG/24 HOURS) 14 mg/24hr patch Place 1 patch (14 mg total) onto the skin daily. (Patient not taking: Reported on 03/31/2018) 28 patch 0   No facility-administered medications prior to visit.     Allergies  Allergen Reactions  . Tramadol Swelling    lips    ROS Review of Systems  Constitutional: Positive for fatigue.  HENT: Negative.   Eyes: Positive for visual disturbance (left sided).  Respiratory: Negative.   Cardiovascular: Negative.   Gastrointestinal: Negative.   Endocrine: Negative.   Musculoskeletal: Negative.   Skin: Negative.   Allergic/Immunologic: Negative.   Neurological: Positive for dizziness and weakness.       Residual left-sided weakness.   Hematological: Negative.   Psychiatric/Behavioral: Negative.       Objective:    Physical Exam  Constitutional: He is oriented to person, place, and time. He appears well-developed and well-nourished.  HENT:  Head: Normocephalic and atraumatic.  Eyes: Conjunctivae are normal.  Left-sided visual disturbance.   Neck: Normal range of motion. Neck supple.  Cardiovascular: Normal rate, regular  rhythm, normal heart sounds and intact distal pulses.  Pulmonary/Chest: Effort normal and breath sounds normal.  Abdominal: Soft. Bowel sounds are normal.  Musculoskeletal: Normal range of motion.  Neurological: He is alert and oriented to person, place, and time. He has normal reflexes.  Skin: Skin is warm and dry.  Psychiatric: He has a normal mood and affect. His behavior is normal. Judgment and thought content normal.  Vitals reviewed.  BP 126/72 (BP Location: Right Arm, Patient Position: Sitting, Cuff Size: Large)   Pulse 84   Temp 98.4 F (36.9 C) (Oral)   Ht  (1.753 m)   Wt 186 lb (84.4 kg)   SpO2 98%   BMI 27.47 kg/m  Wt Readings from Last 3 Encounters:  04/29/18 186 lb (84.4 kg)  03/31/18 177 lb (80.3 kg)  03/18/18 132 lb 4.4 oz (60 kg)     Health Maintenance Due  Topic Date Due  . TETANUS/TDAP  09/11/1996  . INFLUENZA VACCINE  09/11/2017    There are no preventive care reminders to display for this patient.  Lab Results  Component Value Date   TSH 1.570 04/07/2018   Lab Results  Component Value Date   WBC 5.7 04/07/2018   HGB 14.9 04/07/2018   HCT 41.5 04/07/2018   MCV 85 04/07/2018   PLT 257 04/07/2018   Lab Results  Component Value Date   NA 143 04/07/2018   K 4.0 04/07/2018   CO2 22 04/07/2018   GLUCOSE 103 (H) 04/07/2018   BUN 8 04/07/2018   CREATININE 0.94 04/07/2018   BILITOT 0.5 04/07/2018   ALKPHOS 79 04/07/2018   AST 23 04/07/2018   ALT 40 04/07/2018   PROT 6.5 04/07/2018   ALBUMIN 4.7 04/07/2018   CALCIUM 9.3 04/07/2018   ANIONGAP 11 03/19/2018   Lab Results  Component Value Date   CHOL 84 (L) 04/07/2018   Lab Results  Component Value Date   HDL 34 (L) 04/07/2018   Lab Results  Component Value Date   LDLCALC 41 04/07/2018   Lab Results  Component Value Date   TRIG 46 04/07/2018   Lab Results  Component Value Date   CHOLHDL 2.5 04/07/2018   Lab Results  Component Value Date   HGBA1C 5.9 (H) 03/18/2018     Assessment & Plan:   1. Acute right PCA stroke (HCC) Stable. Continue Plavix as prescribed.   2. Erectile dysfunction, unspecified erectile dysfunction type We will initiate Sildenafil today. - sildenafil (VIAGRA) 50 MG tablet; Take 1 tablet (50 mg total) by mouth daily as needed for erectile dysfunction.  Dispense: 10 tablet; Refill: 0  3. Left-sided weakness Stable. Not worsening. He will follow up wit Neurologist as needed.   4. Dizziness We will initiate Meclizine today.  - meclizine (ANTIVERT) 25 MG tablet; Take 1 tablet (25 mg total) by mouth 3 (three) times daily as needed for dizziness.  Dispense: 30 tablet; Refill: 2  5. Fatigue, unspecified type  6. Gastroesophageal reflux disease without esophagitis We will initiate Protonix today.  - pantoprazole (PROTONIX) 40 MG tablet; Take 1 tablet (40 mg total) by mouth daily.  Dispense: 30 tablet; Refill: 3  7. Follow up He will follow up in 6 months.   Meds ordered this encounter  Medications  . sildenafil (VIAGRA) 50 MG tablet    Sig: Take 1 tablet (50 mg total) by mouth daily as needed for erectile dysfunction.    Dispense:  10 tablet    Refill:  0  . meclizine (ANTIVERT) 25 MG tablet    Sig: Take 1 tablet (25 mg total) by mouth 3 (three) times daily as needed for dizziness.    Dispense:  30 tablet    Refill:  2  . pantoprazole (PROTONIX) 40 MG tablet    Sig: Take 1 tablet (40 mg total) by mouth daily.    Dispense:  30 tablet    Refill:  3   No  orders of the defined types were placed in this encounter.   Referral Orders  No referral(s) requested today    Raliegh Ip,  MSN, FNP-C [facility] [address] [place], [place] [address] [phone]   Problem List Items Addressed This Visit      Cardiovascular and Mediastinum   Acute right PCA stroke (HCC) - Primary   Relevant Medications   sildenafil (VIAGRA) 50 MG tablet    Other Visit Diagnoses    Erectile  dysfunction, unspecified erectile dysfunction type       Relevant Medications   sildenafil (VIAGRA) 50 MG tablet   Left-sided weakness       Dizziness       Relevant Medications   meclizine (ANTIVERT) 25 MG tablet   Fatigue, unspecified type       Gastroesophageal reflux disease without esophagitis       Relevant Medications   meclizine (ANTIVERT) 25 MG tablet   pantoprazole (PROTONIX) 40 MG tablet   Follow up          Meds ordered this encounter  Medications  . sildenafil (VIAGRA) 50 MG tablet    Sig: Take 1 tablet (50 mg total) by mouth daily as needed for erectile dysfunction.    Dispense:  10 tablet    Refill:  0  . meclizine (ANTIVERT) 25 MG tablet    Sig: Take 1 tablet (25 mg total) by mouth 3 (three) times daily as needed for dizziness.    Dispense:  30 tablet    Refill:  2  . pantoprazole (PROTONIX) 40 MG tablet    Sig: Take 1 tablet (40 mg total) by mouth daily.    Dispense:  30 tablet    Refill:  3    Follow-up: Return in about 6 months (around 10/30/2018).    Kallie Locks, FNP

## 2018-04-30 ENCOUNTER — Ambulatory Visit: Payer: Self-pay

## 2018-05-06 ENCOUNTER — Ambulatory Visit: Payer: Self-pay

## 2018-05-27 MED FILL — ATORVASTATIN CALCIUM 40 MG: 40 | 30 days supply | Qty: 30 | Fill #1

## 2018-05-27 MED FILL — FOLIC ACID 1 MG TABS: 1 | 30 days supply | Qty: 30 | Fill #1

## 2018-05-27 MED FILL — PANTOPRAZOLE SOD DR 40 MG T: 40 | 30 days supply | Qty: 30 | Fill #1

## 2018-05-28 ENCOUNTER — Telehealth: Payer: Self-pay

## 2018-05-28 NOTE — Telephone Encounter (Signed)
VM left for pt on 05/28/2018 about visit being change to video. I explain we are not doing in office visit due to COVID 19. I stated he can use his phone that has a camera or computer that has a camera. We need consent to do video and to file insurance. Pt needs to download webex on the device he will be using.  05/27/2018 VM was left by Theodoro Doing to call back about doing video visit.

## 2018-06-02 ENCOUNTER — Emergency Department (HOSPITAL_BASED_OUTPATIENT_CLINIC_OR_DEPARTMENT_OTHER): Payer: Self-pay

## 2018-06-02 ENCOUNTER — Encounter (HOSPITAL_COMMUNITY): Payer: Self-pay

## 2018-06-02 ENCOUNTER — Ambulatory Visit (INDEPENDENT_AMBULATORY_CARE_PROVIDER_SITE_OTHER): Payer: Self-pay | Admitting: Family Medicine

## 2018-06-02 ENCOUNTER — Other Ambulatory Visit: Payer: Self-pay

## 2018-06-02 ENCOUNTER — Emergency Department (HOSPITAL_COMMUNITY)
Admission: EM | Admit: 2018-06-02 | Discharge: 2018-06-02 | Disposition: A | Discharge status: Home / Self Care | Payer: Self-pay | Attending: Emergency Medicine | Admitting: Emergency Medicine

## 2018-06-02 ENCOUNTER — Emergency Department (HOSPITAL_COMMUNITY): Payer: Self-pay

## 2018-06-02 ENCOUNTER — Encounter: Payer: Self-pay | Admitting: Family Medicine

## 2018-06-02 DIAGNOSIS — Y929 Unspecified place or not applicable: Secondary | ICD-10-CM | POA: Insufficient documentation

## 2018-06-02 DIAGNOSIS — S76012A Strain of muscle, fascia and tendon of left hip, initial encounter: Secondary | ICD-10-CM | POA: Insufficient documentation

## 2018-06-02 DIAGNOSIS — Z7982 Long term (current) use of aspirin: Secondary | ICD-10-CM | POA: Insufficient documentation

## 2018-06-02 DIAGNOSIS — Z87891 Personal history of nicotine dependence: Secondary | ICD-10-CM | POA: Insufficient documentation

## 2018-06-02 DIAGNOSIS — M7989 Other specified soft tissue disorders: Secondary | ICD-10-CM

## 2018-06-02 DIAGNOSIS — S86912S Strain of unspecified muscle(s) and tendon(s) at lower leg level, left leg, sequela: Secondary | ICD-10-CM

## 2018-06-02 DIAGNOSIS — Z79899 Other long term (current) drug therapy: Secondary | ICD-10-CM | POA: Insufficient documentation

## 2018-06-02 DIAGNOSIS — Y939 Activity, unspecified: Secondary | ICD-10-CM | POA: Insufficient documentation

## 2018-06-02 DIAGNOSIS — I639 Cerebral infarction, unspecified: Secondary | ICD-10-CM | POA: Insufficient documentation

## 2018-06-02 DIAGNOSIS — X58XXXA Exposure to other specified factors, initial encounter: Secondary | ICD-10-CM | POA: Insufficient documentation

## 2018-06-02 DIAGNOSIS — Z8673 Personal history of transient ischemic attack (TIA), and cerebral infarction without residual deficits: Secondary | ICD-10-CM

## 2018-06-02 DIAGNOSIS — Y999 Unspecified external cause status: Secondary | ICD-10-CM | POA: Insufficient documentation

## 2018-06-02 DIAGNOSIS — K5900 Constipation, unspecified: Secondary | ICD-10-CM | POA: Insufficient documentation

## 2018-06-02 DIAGNOSIS — R52 Pain, unspecified: Secondary | ICD-10-CM

## 2018-06-02 DIAGNOSIS — I69354 Hemiplegia and hemiparesis following cerebral infarction affecting left non-dominant side: Secondary | ICD-10-CM

## 2018-06-02 DIAGNOSIS — S96912A Strain of unspecified muscle and tendon at ankle and foot level, left foot, initial encounter: Secondary | ICD-10-CM | POA: Insufficient documentation

## 2018-06-02 DIAGNOSIS — M79605 Pain in left leg: Secondary | ICD-10-CM | POA: Insufficient documentation

## 2018-06-02 DIAGNOSIS — D573 Sickle-cell trait: Secondary | ICD-10-CM | POA: Insufficient documentation

## 2018-06-02 DIAGNOSIS — R531 Weakness: Secondary | ICD-10-CM

## 2018-06-02 LAB — COMPREHENSIVE METABOLIC PANEL WITH GFR
ALT: 49 U/L — ABNORMAL HIGH (ref 0–44)
AST: 37 U/L (ref 15–41)
Albumin: 4.1 g/dL (ref 3.5–5.0)
Alkaline Phosphatase: 76 U/L (ref 38–126)
Anion gap: 10 (ref 5–15)
BUN: 10 mg/dL (ref 6–20)
CO2: 25 mmol/L (ref 22–32)
Calcium: 9.6 mg/dL (ref 8.9–10.3)
Chloride: 105 mmol/L (ref 98–111)
Creatinine, Ser: 0.93 mg/dL (ref 0.61–1.24)
GFR calc Af Amer: >60 mL/min
GFR calc non Af Amer: >60 mL/min
Glucose, Bld: 116 mg/dL — ABNORMAL HIGH (ref 70–99)
Potassium: 4 mmol/L (ref 3.5–5.1)
Sodium: 140 mmol/L (ref 135–145)
Total Bilirubin: 0.8 mg/dL (ref 0.3–1.2)
Total Protein: 6.9 g/dL (ref 6.5–8.1)

## 2018-06-02 LAB — LIPASE, BLOOD: Lipase: 34 U/L (ref 11–51)

## 2018-06-02 LAB — CBC WITH DIFFERENTIAL/PLATELET
Abs Immature Granulocytes: 0.02 K/uL (ref 0.00–0.07)
Basophils Absolute: 0.1 K/uL (ref 0.0–0.1)
Basophils Relative: 1 %
Eosinophils Absolute: 0.2 K/uL (ref 0.0–0.5)
Eosinophils Relative: 2 %
HCT: 45.9 % (ref 39.0–52.0)
Hemoglobin: 15.6 g/dL (ref 13.0–17.0)
Immature Granulocytes: 0 %
Lymphocytes Relative: 32 %
Lymphs Abs: 2.1 K/uL (ref 0.7–4.0)
MCH: 29.1 pg (ref 26.0–34.0)
MCHC: 34 g/dL (ref 30.0–36.0)
MCV: 85.6 fL (ref 80.0–100.0)
Monocytes Absolute: 0.6 K/uL (ref 0.1–1.0)
Monocytes Relative: 9 %
Neutro Abs: 3.7 K/uL (ref 1.7–7.7)
Neutrophils Relative %: 56 %
Platelets: 252 K/uL (ref 150–400)
RBC: 5.36 MIL/uL (ref 4.22–5.81)
RDW: 12.3 % (ref 11.5–15.5)
WBC: 6.7 K/uL (ref 4.0–10.5)
nRBC: 0 % (ref 0.0–0.2)

## 2018-06-02 LAB — URINALYSIS, ROUTINE W REFLEX MICROSCOPIC
Bilirubin Urine: NEGATIVE
Glucose, UA: NEGATIVE mg/dL
Hgb urine dipstick: NEGATIVE
Ketones, ur: NEGATIVE mg/dL
Leukocytes,Ua: NEGATIVE
Nitrite: NEGATIVE
Protein, ur: NEGATIVE mg/dL
Specific Gravity, Urine: 1.016 (ref 1.005–1.030)
pH: 5 (ref 5.0–8.0)

## 2018-06-02 MED ORDER — SODIUM CHLORIDE 0.9 % IV BOLUS
500.0000 mL | Freq: Once | INTRAVENOUS | Status: AC
  Administered 2018-06-02: 500 mL via INTRAVENOUS

## 2018-06-02 MED ORDER — KETOROLAC TROMETHAMINE 15 MG/ML IJ SOLN
15.0000 mg | Freq: Once | INTRAMUSCULAR | Status: AC
  Administered 2018-06-02: 21:00:00 15 mg via INTRAVENOUS
  Filled 2018-06-02: qty 1

## 2018-06-02 MED ORDER — IOHEXOL 300 MG/ML  SOLN
100.0000 mL | Freq: Once | INTRAMUSCULAR | Status: AC | PRN
  Administered 2018-06-02: 100 mL via INTRAVENOUS

## 2018-06-02 MED ORDER — SODIUM CHLORIDE 0.9 % IV BOLUS
500.0000 mL | Freq: Once | INTRAVENOUS | Status: AC
  Administered 2018-06-02: 19:00:00 500 mL via INTRAVENOUS

## 2018-06-02 NOTE — ED Triage Notes (Signed)
Pt c/o pain a left leg and left lower abdomen x 2 days. States difficulty walking. Denies injury or N/V D but c/o constipation x 2 days.

## 2018-06-02 NOTE — ED Notes (Signed)
Patient transported to CT 

## 2018-06-02 NOTE — ED Notes (Addendum)
Patient back from CT.

## 2018-06-02 NOTE — Progress Notes (Signed)
Left lower extremity venous duplex completed. Refer to "CV Proc" under chart review to view preliminary results.  06/02/2018 6:25 PM Gertie Fey, MHA, RVT, RDCS, RDMS

## 2018-06-02 NOTE — Discharge Instructions (Addendum)
You have been diagnosed today with muscle strain of the left leg and constipation.  At this time there does not appear to be the presence of an emergent medical condition, however there is always the potential for conditions to change. Please read and follow the below instructions.  Please return to the Emergency Department immediately for any new or worsening symptoms. Please be sure to follow up with your Primary Care Provider within one week regarding your visit today; please call their office to schedule an appointment even if you are feeling better for a follow-up visit. You have been given a medication called Toradol today to help with your pain.  This medication is an NSAID medication.  Please avoid further NSAID medications including ibuprofen, naproxen, Aleve for the next 2 days.  Please drink plenty of water over the next few days. Additionally increasing your water intake will help with constipation you may also use MiraLAX as directed on the packaging to help with constipation.  Get help right away if: You develop new bowel or bladder control problems. You have unusual weakness or numbness in your arms or legs. You have any of these problems in your injured area: You have numbness. You have tingling. You lose strength. Get help right away if: You have a fever, and your symptoms suddenly get worse. You leak poop or have blood in your poop. Your belly feels hard or bigger than normal (is bloated). You have very bad belly pain. You feel dizzy or you faint. You have numbness or tingling to your genitals or anus You lose control of your bowel or bladder, pee or poop on yourself.  Please read the additional information packets attached to your discharge summary.

## 2018-06-02 NOTE — ED Provider Notes (Signed)
[facility]Y DEPARTMENT Provider Note   CSN: [id number] Arrival date & time: 06/02/18  1704    History   Chief Complaint Chief Complaint  Patient presents with   Leg Pain   Abdominal Pain    HPI [facility] is a 43 y.o. male presenting today for 2 concerns.  Patient's primary complaint today is left leg pain and swelling.  He endorses pain x2 days of the left medial thigh constant throbbing pain worsened with ambulation and palpation without alleviating factors.  He denies any injury or trauma to the area and states that he has never had pain like this in the past.  He reports that per virtual visit with primary care provider they are concerned for DVT of this leg.  He denies any color change of the leg or history of blood clot.  Patient reports compliance with his Plavix.  Patient's second complaint today is of abdominal pain he reports a right lower quadrant pain x2 days constant slightly improved since onset worsened with palpation of the abdomen without alleviating factors.  He denies any nausea vomiting or diarrhea.  Patient feels that his abdomen is somewhat swollen compared to normal and he endorses constipation x2 days.  He denies history of abdominal surgeries or obstruction in the past.  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  Patient denies any other concerns today.    HPI  Past Medical History:  Diagnosis Date   Acid reflux    Hypocalcemia    Sickle cell trait (HCC)    Stroke (cerebrum) [facility])     Patient Active Problem List   Diagnosis Date Noted   Acute leg pain, left 06/02/2018   Stroke (cerebrum) [facility])    Erectile dysfunction 04/29/2018   Left-sided weakness 04/29/2018   Dizziness 04/29/2018   Fatigue 04/29/2018   Gastroesophageal reflux disease without esophagitis 04/29/2018   Screening for  sickle-cell disease or trait 04/01/2018   Acute right PCA stroke (HCC) 03/17/2018    Past Surgical History:  Procedure Laterality Date   TEE WITHOUT CARDIOVERSION N/A 03/19/2018   Procedure: TRANSESOPHAGEAL ECHOCARDIOGRAM (TEE);  Surgeon: Lewayne Bunting, MD;  Location: [facility] ENDOSCOPY;  Service: Cardiovascular;  Laterality: N/A;      Home Medications    Prior to Admission medications   Medication Sig Start Date End Date Taking? Authorizing Provider  aspirin EC 81 MG tablet Take 1 tablet (81 mg total) by mouth daily. Take Plavix 75 mg daily + Aspirin 81 mg with Food Daily For 3 weeks , then STOP Plavix and Take Aspirin only 03/20/18  Yes Emokpae, Courage, MD  atorvastatin (LIPITOR) 40 MG tablet Take 1 tablet (40 mg total) by mouth every evening. 03/20/18  Yes Emokpae, Courage, MD  cetirizine (ZYRTEC) 10 MG tablet Take 10 mg by mouth daily as needed for allergies.   Yes [provider]  clopidogrel (PLAVIX) 75 MG tablet Take 1 tablet (75 mg total) by mouth daily. Take Plavix 75 mg daily + Aspirin 81 mg with Food Daily For 3 weeks , then STOP Plavix and Take Aspirin only 03/21/18  Yes Emokpae, Courage, MD  folic acid (FOLVITE) 1 MG tablet Take 1 tablet (1 mg total) by mouth daily. 03/21/18  Yes Shon Hale, MD  meclizine (ANTIVERT) 25 MG tablet Take 1 tablet (25 mg total) by mouth 3 (three) times daily as needed for dizziness. 04/29/18  Yes Kallie Locks, FNP  Melatonin 1 MG TABS Take 1 mg by mouth daily as needed (sleep).    Yes [provider]  Multiple Vitamin (MULTIVITAMIN WITH MINERALS) TABS tablet Take 1 tablet by mouth daily. 03/21/18  Yes Emokpae, Courage, MD  pantoprazole (PROTONIX) 40 MG tablet Take 1 tablet (40 mg total) by mouth daily. 04/29/18  Yes Kallie Locks, FNP  sildenafil (VIAGRA) 50 MG tablet Take 1 tablet (50 mg total) by mouth daily as needed for erectile dysfunction. 04/29/18  Yes Kallie Locks, FNP  sucralfate (CARAFATE) 1 g tablet Take 1 tablet (1  g total) by mouth 4 (four) times daily -  with meals and at bedtime. 03/20/18  Yes Shon Hale, MD  thiamine 100 MG tablet Take 1 tablet (100 mg total) by mouth daily. 03/21/18  Yes Shon Hale, MD  bismuth subsalicylate (PEPTO-BISMOL) 262 MG/15ML suspension Take 30 mLs by mouth 4 (four) times daily -  before meals and at bedtime. Patient not taking: Reported on 06/02/2018 01/13/15   Lyndal Pulley, MD  fluticasone [employer] 50 MCG/ACT nasal spray Place 2 sprays into both nostrils daily. Patient not taking: Reported on 06/02/2018 09/27/16   Lurline Idol    Family History Family History  Problem Relation Age of Onset   Heart failure Mother     Social History Social History   Tobacco Use   Smoking status: Former Smoker    Packs/day: 0.25    Years: 13.00    Pack years: 3.25    Types: Cigars   Smokeless tobacco: Never Used  Substance Use Topics   Alcohol use: Yes    Alcohol/week: 4.0 standard drinks    Types: 4 Cans of beer per week   Drug use: No     Allergies   Other and Tramadol   Review of Systems Review of Systems  Constitutional: Negative.  Negative for appetite change, chills and fever.  Respiratory: Negative.  Negative for cough and shortness of breath.   Cardiovascular: Positive for leg swelling (Left leg). Negative for chest pain.  Gastrointestinal: Positive for abdominal pain and constipation. Negative for blood in stool, diarrhea, nausea and vomiting.  Genitourinary: Negative.  Negative for difficulty urinating, discharge, dysuria, hematuria, scrotal swelling and testicular pain.  Neurological: Positive for weakness (Residual left sided weakness.). Negative for dizziness, numbness and headaches.       Denies saddle area paresthesias Denies bowel/bladder incontinence Denies urinary retention  All other systems reviewed and are negative.  Physical Exam Updated Vital Signs BP 139/85    Pulse 87    Temp 98.7 F (37.1 C) (Oral)    Resp 17    Ht   (1.753 m)    Wt 81.6 kg    SpO2 98%    BMI 26.58 kg/m   Physical Exam Constitutional:      General: He is not in acute distress.    Appearance: Normal appearance. He is well-developed. He is not ill-appearing or diaphoretic.  HENT:     Head: Normocephalic and atraumatic.     Right Ear: External ear normal.     Left Ear: External ear normal.     Nose: Nose normal.     Mouth/Throat:     Mouth: Mucous membranes are moist.     Pharynx: Oropharynx is clear.  Eyes:     General: Vision grossly intact. Gaze aligned appropriately.     Pupils: Pupils are equal, round, and reactive to light.  Neck:  Musculoskeletal: Normal range of motion.     Trachea: Trachea and phonation normal. No tracheal deviation.  Cardiovascular:     Rate and Rhythm: Normal rate and regular rhythm.     Pulses:          Dorsalis pedis pulses are 2+ on the right side and 2+ on the left side.       Posterior tibial pulses are 2+ on the right side and 2+ on the left side.     Heart sounds: Normal heart sounds.  Pulmonary:     Effort: Pulmonary effort is normal. No respiratory distress.     Breath sounds: Normal breath sounds. No wheezing or rhonchi.  Abdominal:     General: Bowel sounds are normal. There is distension (Questionable).     Palpations: Abdomen is soft.     Tenderness: There is abdominal tenderness in the right lower quadrant and periumbilical area. There is no guarding or rebound.    Genitourinary:    Comments: Deferred by patient. Musculoskeletal: Normal range of motion.     Right knee: Normal.     Left knee: He exhibits normal range of motion, no swelling and no deformity. No tenderness found.     Right ankle: Normal.     Left ankle: He exhibits normal range of motion, no swelling, no deformity and normal pulse. No tenderness. Achilles tendon normal.     Left upper leg: He exhibits tenderness.     Left lower leg: He exhibits tenderness.       Legs:     Comments: Tenderness of the left thigh  and left gastrocnemius musculature.  No color change or noticeable swelling when compared to the right leg.  No tactile temperature difference between the legs.  No midline C/T/L spinal tenderness to palpation, no paraspinal muscle tenderness, no deformity, crepitus, or step-off noted. No sign of injury to the neck or back.  Ambulatory around room without assistance, slight limp reports due to pain.  Feet:     Right foot:     Protective Sensation: 5 sites tested. 5 sites sensed.     Left foot:     Protective Sensation: 5 sites tested. 5 sites sensed.  Skin:    General: Skin is warm and dry.  Neurological:     Mental Status: He is alert.     GCS: GCS eye subscore is 4. GCS verbal subscore is 5. GCS motor subscore is 6.     Comments: Speech is clear and goal oriented, follows commands Major Cranial nerves without deficit, no facial droop Moves extremities without ataxia, coordination intact  Psychiatric:        Behavior: Behavior normal.    ED Treatments / Results  Labs (all labs ordered are listed, but only abnormal results are displayed) Labs Reviewed  COMPREHENSIVE METABOLIC PANEL - Abnormal; Notable for the following components:      Result Value   Glucose, Bld 116 (*)    ALT 49 (*)    All other components within normal limits  CBC WITH DIFFERENTIAL/PLATELET  LIPASE, BLOOD  URINALYSIS, ROUTINE W REFLEX MICROSCOPIC    EKG None  Radiology Ct Abdomen Pelvis W Contrast  Result Date: 06/02/2018 CLINICAL DATA:  Bowel obstruction.  Left lower abdominal pain. EXAM: CT ABDOMEN AND PELVIS WITH CONTRAST TECHNIQUE: Multidetector CT imaging of the abdomen and pelvis was performed using the standard protocol following bolus administration of intravenous contrast. CONTRAST:  OMNIPAQUE IOHEXOL 300 MG/ML  SOLN COMPARISON:  07/10/2017 FINDINGS: Lower  chest: Linear areas of atelectasis or scarring in the lung bases. No effusions. Hepatobiliary: Diffuse low-density throughout the liver  compatible with fatty infiltration. No focal abnormality. Gallbladder unremarkable. Pancreas: No focal abnormality or ductal dilatation. Spleen: No focal abnormality.  Normal size. Adrenals/Urinary Tract: No adrenal abnormality. No focal renal abnormality. No stones or hydronephrosis. Urinary bladder is unremarkable. Stomach/Bowel: Normal appendix. Stomach, large and small bowel grossly unremarkable. No evidence of bowel obstruction. Vascular/Lymphatic: No evidence of aneurysm or adenopathy. Reproductive: Mildly prominent prostate Other: No free fluid or free air. Musculoskeletal: No acute bony abnormality. IMPRESSION: Mild fatty infiltration of the liver. No evidence of bowel obstruction. Normal appendix. No acute findings in the abdomen or pelvis. Electronically Signed   By: Charlett NoseKevin  Dover M.D.   On: 06/02/2018 20:04   Vas [place] Lower Extremity Venous (dvt) (only [facility])  Result Date: 06/02/2018  Lower Venous Study Indications: Pain, and Swelling.  Performing Technologist: Gertie FeyMichelle Simonetti MHA, RDMS, RVT, RDCS  Examination Guidelines: A complete evaluation includes B-mode imaging, spectral Doppler, color Doppler, and power Doppler as needed of all accessible portions of each vessel. Bilateral testing is considered an integral part of a complete examination. Limited examinations for reoccurring indications may be performed as noted.  +-----+---------------+---------+-----------+----------+--------------+  RIGHT Compressibility Phasicity Spontaneity Properties Summary         +-----+---------------+---------+-----------+----------+--------------+  CFV                                                    Not visualized  +-----+---------------+---------+-----------+----------+--------------+   +---------+---------------+---------+-----------+----------+-------+  LEFT      Compressibility Phasicity Spontaneity Properties Summary  +---------+---------------+---------+-----------+----------+-------+  CFV       Full             Yes       Yes                             +---------+---------------+---------+-----------+----------+-------+  SFJ       Full                                                      +---------+---------------+---------+-----------+----------+-------+  FV Prox   Full                                                      +---------+---------------+---------+-----------+----------+-------+  FV Mid    Full                                                      +---------+---------------+---------+-----------+----------+-------+  FV Distal Full                                                      +---------+---------------+---------+-----------+----------+-------+  PFV       Full                                                      +---------+---------------+---------+-----------+----------+-------+  POP       Full            Yes       Yes                             +---------+---------------+---------+-----------+----------+-------+  PTV       Full                                                      +---------+---------------+---------+-----------+----------+-------+  PERO      Full                                                      +---------+---------------+---------+-----------+----------+-------+     Summary: Left: There is no evidence of deep vein thrombosis in the lower extremity. No cystic structure found in the popliteal fossa.  *See table(s) above for measurements and observations.    Preliminary     Procedures Procedures (including critical care time)  Medications Ordered in ED Medications  sodium chloride 0.9 % bolus 500 mL (0 mLs Intravenous Stopped 06/02/18 1915)  iohexol (OMNIPAQUE) 300 MG/ML solution 100 mL (100 mLs Intravenous Contrast Given 06/02/18 1942)  ketorolac (TORADOL) 15 MG/ML injection 15 mg (15 mg Intravenous Given 06/02/18 2039)  sodium chloride 0.9 % bolus 500 mL (500 mLs Intravenous New Bag/Given 06/02/18 2035)     Initial Impression / Assessment and Plan / ED Course  I have  reviewed the triage vital signs and the nursing notes.  Pertinent labs & imaging results that were available during my care of the patient were reviewed by me and considered in my medical decision making (see chart for details).    CBC within normal limits CMP unremarkable Lipase within normal limits Urinalysis within normal limits Left lower extremity DVT study:  Summary:  Left: There is no evidence of deep vein thrombosis in the lower extremity. No cystic structure found in the popliteal fossa.   CT abdomen pelvis:  IMPRESSION:  Mild fatty infiltration of the liver.    No evidence of bowel obstruction.    Normal appendix.    No acute findings in the abdomen or pelvis.  ================== Suspect muscular etiology of patient's left lower extremity pain at this time.  Do not suspect arterial compromise as pedal pulses intact and equal bilaterally.  DVT study without evidence of clot.  No signs of cellulitis or septic arthritis or infection.  Full range of motion and appropriate strength with movements however with increased muscular pain.  Capillary refill and sensation intact to all toes.  Tender with light palpation of the musculature of the left leg, specifically left hip abductors and left gastrocnemius.  No history of injury or trauma.  Compartments soft to palpation.  Do not suspect rhabdomyolysis  as patient without pain or tenderness anywhere else on his body.  As patient with no history of injury or trauma low suspicion for fracture this time.  Additionally patient without back pain or cauda equina symptoms, no history of fever, weight loss or cancer.  Discussed with Dr. Clarene Duke, further imaging of left lower extremity not indicated at this time.  15 mg IV Toradol given.  Patient educated on avoiding further NSAIDs for the next 2 days and increasing water hydration.  Reassessed approximately 20 minutes after Toradol administration, reports improvement of his symptoms.  As to  patient's right lower quadrant abdominal pain and 2 days of constipation CT scan is negative for acute findings.  Patient informed of hepato-steatosis and to follow-up with PCP.  Discussed MiraLAX and increasing his water hydration.  He states understanding states minimal abdominal pain at this time and that is been improving over the past 2 days.  Reexamination of the abdomen is without peritoneal signs.  Encouraged PCP follow-up.  At this time there does not appear to be any evidence of an acute emergency medical condition and the patient appears stable for discharge with appropriate outpatient follow up. Diagnosis was discussed with patient who verbalizes understanding of care plan and is agreeable to discharge. I have discussed return precautions with patient who verbalizes understanding of return precautions. Patient encouraged to follow-up with their PCP. All questions answered.  Patient has been discharged in good condition.  Patient's case discussed with Dr. Clarene Duke who agrees with plan to discharge with follow-up.   Note: Portions of this report may have been transcribed using voice recognition software. Every effort was made to ensure accuracy; however, inadvertent computerized transcription errors may still be present. Final Clinical Impressions(s) / ED Diagnoses   Final diagnoses:  Constipation, unspecified constipation type  Muscle strain of left lower leg, sequela  Strain of left hip adductor muscle, initial encounter    ED Discharge Orders    None       Elizabeth Palau 06/02/18 2129    Little, Ambrose Finland, MD 06/07/18 1032

## 2018-06-02 NOTE — Progress Notes (Signed)
Virtual Visit via Telephone Note  I connected with [facility] on 06/02/18 at 11:00 AM EDT by telephone and verified that I am speaking with the correct person using two identifiers.   I discussed the limitations, risks, security and privacy concerns of performing an evaluation and management service by telephone and the availability of in person appointments. I also discussed with the patient that there may be a patient responsible charge related to this service. The patient expressed understanding and agreed to proceed.   History of Present Illness:  Past Medical History:  Diagnosis Date  . Acid reflux   . Hypocalcemia   . Sickle cell trait (HCC)     Current Outpatient Medications on File Prior to Visit  Medication Sig Dispense Refill  . alum & mag hydroxide-simeth (MAALOX PLUS) 400-400-40 MG/5ML suspension Take 15 mLs by mouth as needed for indigestion.    Marland Kitchen. aspirin EC 81 MG tablet Take 1 tablet (81 mg total) by mouth daily. Take Plavix 75 mg daily + Aspirin 81 mg with Food Daily For 3 weeks , then STOP Plavix and Take Aspirin only 30 tablet 11  . atorvastatin (LIPITOR) 40 MG tablet Take 1 tablet (40 mg total) by mouth every evening. 30 tablet 5  . bismuth subsalicylate (PEPTO-BISMOL) 262 MG/15ML suspension Take 30 mLs by mouth 4 (four) times daily -  before meals and at bedtime. 1700 mL 0  . Cal Carb-Mag Hydrox-Simeth (ROLAIDS ADVANCED PO) Take 3 tablets by mouth as needed (heartburn).    . cetirizine (ZYRTEC) 10 MG tablet Take 10 mg by mouth daily as needed for allergies.    Marland Kitchen. clopidogrel (PLAVIX) 75 MG tablet Take 1 tablet (75 mg total) by mouth daily. Take Plavix 75 mg daily + Aspirin 81 mg with Food Daily For 3 weeks , then STOP Plavix and Take Aspirin only 21 tablet 0  . fluticasone (FLONASE) 50 MCG/ACT nasal spray Place 2 sprays into both nostrils daily. 1 g 0  . folic acid (FOLVITE) 1 MG tablet Take 1 tablet (1 mg total) by mouth daily. 30 tablet 5  . meclizine (ANTIVERT) 25  MG tablet Take 1 tablet (25 mg total) by mouth 3 (three) times daily as needed for dizziness. 30 tablet 2  . Melatonin 1 MG TABS Take 1 mg by mouth.    . Multiple Vitamin (MULTIVITAMIN WITH MINERALS) TABS tablet Take 1 tablet by mouth daily. 30 tablet 5  . pantoprazole (PROTONIX) 40 MG tablet Take 1 tablet (40 mg total) by mouth daily. 30 tablet 3  . sildenafil (VIAGRA) 50 MG tablet Take 1 tablet (50 mg total) by mouth daily as needed for erectile dysfunction. 10 tablet 0  . sucralfate (CARAFATE) 1 g tablet Take 1 tablet (1 g total) by mouth 4 (four) times daily -  with meals and at bedtime. 120 tablet 1  . thiamine 100 MG tablet Take 1 tablet (100 mg total) by mouth daily. 30 tablet 5   No current facility-administered medications on file prior to visit.     Current Status: Since his last office visit, he has c/o left leg pain today. He states that he has had pain since X 3 days now. He is taking Motrin to aide in relief of leg pain. He states that his leg is painful, swollen and has began to worsen . He also states that his abdomen is swollen and ridget. No reports of GI problems such as nausea, vomiting, diarrhea, and constipation. He has no reports of  blood in stools, dysuria and hematuria. Patient states that he is unsure if he has ever had DVT. He denies fevers, chills, fatigue, recent infections, weight loss, and night sweats. He has not had any headaches, visual changes, dizziness, and falls. No chest pain, heart palpitations, cough and shortness of breath reported.    Observations/Objective:  Telephone Virtual Visit    Assessment and Plan:  1. Acute leg pain, left Possible DVT. Patient is advised to report to ED asap. Verbalized understanding.   2. History of stroke Recent history right PCA stroke on 03/17/2018.   3. Left-sided weakness Residual.  No orders of the defined types were placed in this encounter.  No orders of the defined types were placed in this  encounter.   Referral Orders  No referral(s) requested today    Raliegh Ip,  MSN, FNP-C [facility] [address] [place], [place] [address] [phone]   Follow Up Instructions:  He will follow up at our office after ED evaluation.     I discussed the assessment and treatment plan with the patient. The patient was provided an opportunity to ask questions and all were answered. The patient agreed with the plan and demonstrated an understanding of the instructions.   The patient was advised to call back or seek an in-person evaluation if the symptoms worsen or if the condition fails to improve as anticipated.  I provided 15-20 minutes of non-face-to-face time during this encounter.   Kallie Locks, FNP

## 2018-06-03 ENCOUNTER — Ambulatory Visit: Payer: Self-pay | Admitting: Neurology

## 2018-06-10 ENCOUNTER — Telehealth: Payer: Self-pay

## 2018-06-10 ENCOUNTER — Encounter: Payer: Self-pay | Admitting: Family Medicine

## 2018-06-10 ENCOUNTER — Other Ambulatory Visit: Payer: Self-pay | Admitting: Family Medicine

## 2018-06-10 DIAGNOSIS — M62838 Other muscle spasm: Secondary | ICD-10-CM

## 2018-06-10 DIAGNOSIS — K5900 Constipation, unspecified: Secondary | ICD-10-CM

## 2018-06-10 MED ORDER — SUCRALFATE 1 G PO TABS: 1.0000 g | ORAL_TABLET | Freq: Three times a day (TID) | ORAL | 2 refills | Status: DC

## 2018-06-10 MED ORDER — CYCLOBENZAPRINE HCL 10 MG PO TABS: 10.0000 mg | ORAL_TABLET | Freq: Three times a day (TID) | ORAL | 2 refills | Status: DC | PRN

## 2018-06-10 NOTE — Telephone Encounter (Signed)
-----   Message from Kallie Locks, FNP sent at 06/10/2018 10:06 AM EDT ----- Regarding: "Medications" Rxs for Flexeril and Carafate sent to pharmacy today. Please inform patient.

## 2018-06-10 NOTE — Telephone Encounter (Signed)
Left a vm that medications have been sent to pharmacy

## 2018-06-11 MED FILL — SUCRALFATE 1 GM TABLET: 1 | 30 days supply | Qty: 120 | Fill #0

## 2018-06-11 MED FILL — CYCLOBENZAPRINE 10 MG TAB: 10 | 10 days supply | Qty: 30 | Fill #0

## 2018-06-15 ENCOUNTER — Telehealth: Payer: Self-pay

## 2018-06-15 NOTE — Telephone Encounter (Signed)
-----   Message from Natalie M Stroud, FNP sent at 06/10/2018 10:06 AM EDT ----- Regarding: "Medications" Rxs for Flexeril and Carafate sent to pharmacy today. Please inform patient.   

## 2018-06-15 NOTE — Telephone Encounter (Signed)
Patient notified and will contact pharmacy  

## 2018-10-30 ENCOUNTER — Telehealth: Payer: Self-pay | Admitting: Family Medicine

## 2018-10-30 ENCOUNTER — Encounter: Payer: Self-pay | Admitting: Family Medicine

## 2018-10-30 ENCOUNTER — Other Ambulatory Visit: Payer: Self-pay

## 2018-10-30 ENCOUNTER — Ambulatory Visit (INDEPENDENT_AMBULATORY_CARE_PROVIDER_SITE_OTHER): Payer: Self-pay | Admitting: Family Medicine

## 2018-10-30 VITALS — BP 130/77 | HR 90 | Temp 98.0°F | Ht 69.0 in | Wt 176.6 lb

## 2018-10-30 DIAGNOSIS — N529 Male erectile dysfunction, unspecified: Secondary | ICD-10-CM

## 2018-10-30 DIAGNOSIS — R42 Dizziness and giddiness: Secondary | ICD-10-CM

## 2018-10-30 DIAGNOSIS — R7309 Other abnormal glucose: Secondary | ICD-10-CM

## 2018-10-30 DIAGNOSIS — G4700 Insomnia, unspecified: Secondary | ICD-10-CM | POA: Insufficient documentation

## 2018-10-30 DIAGNOSIS — Z0000 Encounter for general adult medical examination without abnormal findings: Secondary | ICD-10-CM

## 2018-10-30 DIAGNOSIS — Z8673 Personal history of transient ischemic attack (TIA), and cerebral infarction without residual deficits: Secondary | ICD-10-CM

## 2018-10-30 DIAGNOSIS — Z789 Other specified health status: Secondary | ICD-10-CM

## 2018-10-30 DIAGNOSIS — K219 Gastro-esophageal reflux disease without esophagitis: Secondary | ICD-10-CM

## 2018-10-30 DIAGNOSIS — R531 Weakness: Secondary | ICD-10-CM

## 2018-10-30 DIAGNOSIS — Z7289 Other problems related to lifestyle: Secondary | ICD-10-CM

## 2018-10-30 DIAGNOSIS — F1090 Alcohol use, unspecified, uncomplicated: Secondary | ICD-10-CM | POA: Insufficient documentation

## 2018-10-30 DIAGNOSIS — H538 Other visual disturbances: Secondary | ICD-10-CM | POA: Insufficient documentation

## 2018-10-30 DIAGNOSIS — G43909 Migraine, unspecified, not intractable, without status migrainosus: Secondary | ICD-10-CM | POA: Insufficient documentation

## 2018-10-30 DIAGNOSIS — Z720 Tobacco use: Secondary | ICD-10-CM | POA: Insufficient documentation

## 2018-10-30 DIAGNOSIS — Z09 Encounter for follow-up examination after completed treatment for conditions other than malignant neoplasm: Secondary | ICD-10-CM

## 2018-10-30 LAB — POCT GLYCOSYLATED HEMOGLOBIN (HGB A1C)
HbA1c POC (<> result, manual entry): 5.4 % (ref 4.0–5.6)
HbA1c, POC (controlled diabetic range): 5.4 % (ref 0.0–7.0)
HbA1c, POC (prediabetic range): 5.4 % — AB (ref 5.7–6.4)
Hemoglobin A1C: 5.4 % (ref 4.0–5.6)

## 2018-10-30 LAB — POCT URINALYSIS DIPSTICK
Bilirubin, UA: NEGATIVE
Blood, UA: NEGATIVE
Glucose, UA: NEGATIVE
Ketones, UA: NEGATIVE
Leukocytes, UA: NEGATIVE
Nitrite, UA: NEGATIVE
Protein, UA: NEGATIVE
Spec Grav, UA: >=1.03 — AB (ref 1.010–1.025)
Urobilinogen, UA: 0.2 E.U./dL
pH, UA: 6 (ref 5.0–8.0)

## 2018-10-30 LAB — POCT CBG (FASTING - GLUCOSE)-MANUAL ENTRY: Glucose Fasting, POC: 114 mg/dL — AB (ref 70–99)

## 2018-10-30 MED ORDER — TOPIRAMATE 50 MG PO TABS: 50.0000 mg | ORAL_TABLET | Freq: Two times a day (BID) | ORAL | 2 refills | Status: DC

## 2018-10-30 MED ORDER — SILDENAFIL CITRATE 100 MG PO TABS: 100.0000 mg | ORAL_TABLET | Freq: Every day | ORAL | 3 refills | Status: DC | PRN

## 2018-10-30 MED ORDER — TRAZODONE HCL 50 MG PO TABS: 25.0000 mg | ORAL_TABLET | Freq: Every evening | ORAL | 3 refills | Status: DC | PRN

## 2018-10-30 MED ORDER — PANTOPRAZOLE SODIUM 40 MG PO TBEC: 40.0000 mg | DELAYED_RELEASE_TABLET | Freq: Every day | ORAL | 3 refills | Status: DC

## 2018-10-30 MED FILL — TOPIRAMATE 50 MG TABLET: 50 | 30 days supply | Qty: 60 | Fill #0

## 2018-10-30 MED FILL — traZODone HCL 50 MG TABS: 50 | 30 days supply | Qty: 30 | Fill #0

## 2018-10-30 MED FILL — SILDENAFIL CITRATE 100 MG T: 100 | 30 days supply | Qty: 10 | Fill #0

## 2018-10-30 NOTE — Telephone Encounter (Signed)
Sent to NP 

## 2018-10-30 NOTE — Patient Instructions (Signed)
Trazodone tablets What is this medicine? TRAZODONE (TRAZ oh done) is used to treat depression. This medicine may be used for other purposes; ask your health care provider or pharmacist if you have questions. COMMON BRAND NAME(S): Desyrel What should I tell my health care provider before I take this medicine? They need to know if you have any of these conditions:  attempted suicide or thinking about it  bipolar disorder  bleeding problems  glaucoma  heart disease, or previous heart attack  irregular heart beat  kidney or liver disease  low levels of sodium in the blood  an unusual or allergic reaction to trazodone, other medicines, foods, dyes or preservatives  pregnant or trying to get pregnant  breast-feeding How should I use this medicine? Take this medicine by mouth with a glass of water. Follow the directions on the prescription label. Take this medicine shortly after a meal or a light snack. Take your medicine at regular intervals. Do not take your medicine more often than directed. Do not stop taking this medicine suddenly except upon the advice of your doctor. Stopping this medicine too quickly may cause serious side effects or your condition may worsen. A special MedGuide will be given to you by the pharmacist with each prescription and refill. Be sure to read this information carefully each time. Talk to your pediatrician regarding the use of this medicine in children. Special care may be needed. Overdosage: If you think you have taken too much of this medicine contact a poison control center or emergency room at once. NOTE: This medicine is only for you. Do not share this medicine with others. What if I miss a dose? If you miss a dose, take it as soon as you can. If it is almost time for your next dose, take only that dose. Do not take double or extra doses. What may interact with this medicine? Do not take this medicine with any of the following  medications:  certain medicines for fungal infections like fluconazole, itraconazole, ketoconazole, posaconazole, voriconazole  cisapride  dronedarone  linezolid  MAOIs like Carbex, Eldepryl, Marplan, Nardil, and Parnate  mesoridazine  methylene blue (injected into a vein)  pimozide  saquinavir  thioridazine This medicine may also interact with the following medications:  alcohol  antiviral medicines for HIV or AIDS  aspirin and aspirin-like medicines  barbiturates like phenobarbital  certain medicines for blood pressure, heart disease, irregular heart beat  certain medicines for depression, anxiety, or psychotic disturbances  certain medicines for migraine headache like almotriptan, eletriptan, frovatriptan, naratriptan, rizatriptan, sumatriptan, zolmitriptan  certain medicines for seizures like carbamazepine and phenytoin  certain medicines for sleep  certain medicines that treat or prevent blood clots like dalteparin, enoxaparin, warfarin  digoxin  fentanyl  lithium  NSAIDS, medicines for pain and inflammation, like ibuprofen or naproxen  other medicines that prolong the QT interval (cause an abnormal heart rhythm) like dofetilide  rasagiline  supplements like St. John's wort, kava kava, valerian  tramadol  tryptophan This list may not describe all possible interactions. Give your health care provider a list of all the medicines, herbs, non-prescription drugs, or dietary supplements you use. Also tell them if you smoke, drink alcohol, or use illegal drugs. Some items may interact with your medicine. What should I watch for while using this medicine? Tell your doctor if your symptoms do not get better or if they get worse. Visit your doctor or health care professional for regular checks on your progress. Because it may take  several weeks to see the full effects of this medicine, it is important to continue your treatment as prescribed by your  doctor. Patients and their families should watch out for new or worsening thoughts of suicide or depression. Also watch out for sudden changes in feelings such as feeling anxious, agitated, panicky, irritable, hostile, aggressive, impulsive, severely restless, overly excited and hyperactive, or not being able to sleep. If this happens, especially at the beginning of treatment or after a change in dose, call your health care professional. Bonita Quin may get drowsy or dizzy. Do not drive, use machinery, or do anything that needs mental alertness until you know how this medicine affects you. Do not stand or sit up quickly, especially if you are an older patient. This reduces the risk of dizzy or fainting spells. Alcohol may interfere with the effect of this medicine. Avoid alcoholic drinks. This medicine may cause dry eyes and blurred vision. If you wear contact lenses you may feel some discomfort. Lubricating drops may help. See your eye doctor if the problem does not go away or is severe. Your mouth may get dry. Chewing sugarless gum, sucking hard candy and drinking plenty of water may help. Contact your doctor if the problem does not go away or is severe. What side effects may I notice from receiving this medicine? Side effects that you should report to your doctor or health care professional as soon as possible:  allergic reactions like skin rash, itching or hives, swelling of the face, lips, or tongue  elevated mood, decreased need for sleep, racing thoughts, impulsive behavior  confusion  fast, irregular heartbeat  feeling faint or lightheaded, falls  feeling agitated, angry, or irritable  loss of balance or coordination  painful or prolonged erections  restlessness, pacing, inability to keep still  suicidal thoughts or other mood changes  tremors  trouble sleeping  seizures  unusual bleeding or bruising Side effects that usually do not require medical attention (report to your doctor  or health care professional if they continue or are bothersome):  change in sex drive or performance  change in appetite or weight  constipation  headache  muscle aches or pains  nausea This list may not describe all possible side effects. Call your doctor for medical advice about side effects. You may report side effects to FDA at 1-800-FDA-1088. Where should I keep my medicine? Keep out of the reach of children. Store at room temperature between 15 and 30 degrees C (59 to 86 degrees F). Protect from light. Keep container tightly closed. Throw away any unused medicine after the expiration date. NOTE: This sheet is a summary. It may not cover all possible information. If you have questions about this medicine, talk to your doctor, pharmacist, or health care provider.  2020 Elsevier/Gold Standard (2018-01-20 11:46:46) Insomnia Insomnia is a sleep disorder that makes it difficult to fall asleep or stay asleep. Insomnia can cause fatigue, low energy, difficulty concentrating, mood swings, and poor performance at work or school. There are three different ways to classify insomnia:  Difficulty falling asleep.  Difficulty staying asleep.  Waking up too early in the morning. Any type of insomnia can be long-term (chronic) or short-term (acute). Both are common. Short-term insomnia usually lasts for three months or less. Chronic insomnia occurs at least three times a week for longer than three months. What are the causes? Insomnia may be caused by another condition, situation, or substance, such as:  Anxiety.  Certain medicines.  Gastroesophageal reflux disease (  GERD) or other gastrointestinal conditions.  Asthma or other breathing conditions.  Restless legs syndrome, sleep apnea, or other sleep disorders.  Chronic pain.  Menopause.  Stroke.  Abuse of alcohol, tobacco, or illegal drugs.  Mental health conditions, such as depression.  Caffeine.  Neurological disorders,  such as Alzheimer's disease.  An overactive thyroid (hyperthyroidism). Sometimes, the cause of insomnia may not be known. What increases the risk? Risk factors for insomnia include:  Gender. Women are affected more often than men.  Age. Insomnia is more common as you get older.  Stress.  Lack of exercise.  Irregular work schedule or working night shifts.  Traveling between different time zones.  Certain medical and mental health conditions. What are the signs or symptoms? If you have insomnia, the main symptom is having trouble falling asleep or having trouble staying asleep. This may lead to other symptoms, such as:  Feeling fatigued or having low energy.  Feeling nervous about going to sleep.  Not feeling rested in the morning.  Having trouble concentrating.  Feeling irritable, anxious, or depressed. How is this diagnosed? This condition may be diagnosed based on:  Your symptoms and medical history. Your health care provider may ask about: ? Your sleep habits. ? Any medical conditions you have. ? Your mental health.  A physical exam. How is this treated? Treatment for insomnia depends on the cause. Treatment may focus on treating an underlying condition that is causing insomnia. Treatment may also include:  Medicines to help you sleep.  Counseling or therapy.  Lifestyle adjustments to help you sleep better. Follow these instructions at home: Eating and drinking   Limit or avoid alcohol, caffeinated beverages, and cigarettes, especially close to bedtime. These can disrupt your sleep.  Do not eat a large meal or eat spicy foods right before bedtime. This can lead to digestive discomfort that can make it hard for you to sleep. Sleep habits   Keep a sleep diary to help you and your health care provider figure out what could be causing your insomnia. Write down: ? When you sleep. ? When you wake up during the night. ? How well you sleep. ? How rested you  feel the next day. ? Any side effects of medicines you are taking. ? What you eat and drink.  Make your bedroom a dark, comfortable place where it is easy to fall asleep. ? Put up shades or blackout curtains to block light from outside. ? Use a white noise machine to block noise. ? Keep the temperature cool.  Limit screen use before bedtime. This includes: ? Watching TV. ? Using your smartphone, tablet, or computer.  Stick to a routine that includes going to bed and waking up at the same times every day and night. This can help you fall asleep faster. Consider making a quiet activity, such as reading, part of your nighttime routine.  Try to avoid taking naps during the day so that you sleep better at night.  Get out of bed if you are still awake after 15 minutes of trying to sleep. Keep the lights down, but try reading or doing a quiet activity. When you feel sleepy, go back to bed. General instructions  Take over-the-counter and prescription medicines only as told by your health care provider.  Exercise regularly, as told by your health care provider. Avoid exercise starting several hours before bedtime.  Use relaxation techniques to manage stress. Ask your health care provider to suggest some techniques that may work well  for you. These may include: ? Breathing exercises. ? Routines to release muscle tension. ? Visualizing peaceful scenes.  Make sure that you drive carefully. Avoid driving if you feel very sleepy.  Keep all follow-up visits as told by your health care provider. This is important. Contact a health care provider if:  You are tired throughout the day.  You have trouble in your daily routine due to sleepiness.  You continue to have sleep problems, or your sleep problems get worse. Get help right away if:  You have serious thoughts about hurting yourself or someone else. If you ever feel like you may hurt yourself or others, or have thoughts about taking your  own life, get help right away. You can go to your nearest emergency department or call:  Your local emergency services (911 in the [place]  A suicide crisis helpline, such as the [facility] at (314) 044-7297. This is open 24 hours a day. Summary  Insomnia is a sleep disorder that makes it difficult to fall asleep or stay asleep.  Insomnia can be long-term (chronic) or short-term (acute).  Treatment for insomnia depends on the cause. Treatment may focus on treating an underlying condition that is causing insomnia.  Keep a sleep diary to help you and your health care provider figure out what could be causing your insomnia. This information is not intended to replace advice given to you by your health care provider. Make sure you discuss any questions you have with your health care provider. Document Released: 01/26/2000 Document Revised: 01/10/2017 Document Reviewed: 11/07/2016 Elsevier Patient Education  2020 [employer]. Migraine Headache A migraine headache is a very strong throbbing pain on one side or both sides of your head. This type of headache can also cause other symptoms. It can last from 4 hours to 3 days. Talk with your doctor about what things may bring on (trigger) this condition. What are the causes? The exact cause of this condition is not known. This condition may be triggered or caused by:  Drinking alcohol.  Smoking.  Taking medicines, such as: ? Medicine used to treat chest pain (nitroglycerin). ? Birth control pills. ? Estrogen. ? Some blood pressure medicines.  Eating or drinking certain products.  Doing physical activity. Other things that may trigger a migraine headache include:  Having a menstrual period.  Pregnancy.  Hunger.  Stress.  Not getting enough sleep or getting too much sleep.  Weather changes.  Tiredness (fatigue). What increases the risk?  Being 72-59 years old.  Being male.  Having a family  history of migraine headaches.  Being Caucasian.  Having depression or anxiety.  Being very overweight. What are the signs or symptoms?  A throbbing pain. This pain may: ? Happen in any area of the head, such as on one side or both sides. ? Make it hard to do daily activities. ? Get worse with physical activity. ? Get worse around bright lights or loud noises.  Other symptoms may include: ? Feeling sick to your stomach (nauseous). ? Vomiting. ? Dizziness. ? Being sensitive to bright lights, loud noises, or smells.  Before you get a migraine headache, you may get warning signs (an aura). An aura may include: ? Seeing flashing lights or having blind spots. ? Seeing bright spots, halos, or zigzag lines. ? Having tunnel vision or blurred vision. ? Having numbness or a tingling feeling. ? Having trouble talking. ? Having weak muscles.  Some people have symptoms after a migraine headache (postdromal  phase), such as: ? Tiredness. ? Trouble thinking (concentrating). How is this treated?  Taking medicines that: ? Relieve pain. ? Relieve the feeling of being sick to your stomach. ? Prevent migraine headaches.  Treatment may also include: ? Having acupuncture. ? Avoiding foods that bring on migraine headaches. ? Learning ways to control your body functions (biofeedback). ? Therapy to help you know and deal with negative thoughts (cognitive behavioral therapy). Follow these instructions at home: Medicines  Take over-the-counter and prescription medicines only as told by your doctor.  Ask your doctor if the medicine prescribed to you: ? Requires you to avoid driving or using heavy machinery. ? Can cause trouble pooping (constipation). You may need to take these steps to prevent or treat trouble pooping:  Drink enough fluid to keep your pee (urine) pale yellow.  Take over-the-counter or prescription medicines.  Eat foods that are high in fiber. These include beans, whole  grains, and fresh fruits and vegetables.  Limit foods that are high in fat and sugar. These include fried or sweet foods. Lifestyle  Do not drink alcohol.  Do not use any products that contain nicotine or tobacco, such as cigarettes, e-cigarettes, and chewing tobacco. If you need help quitting, ask your doctor.  Get at least 8 hours of sleep every night.  Limit and deal with stress. General instructions      Keep a journal to find out what may bring on your migraine headaches. For example, write down: ? What you eat and drink. ? How much sleep you get. ? Any change in what you eat or drink. ? Any change in your medicines.  If you have a migraine headache: ? Avoid things that make your symptoms worse, such as bright lights. ? It may help to lie down in a dark, quiet room. ? Do not drive or use heavy machinery. ? Ask your doctor what activities are safe for you.  Keep all follow-up visits as told by your doctor. This is important. Contact a doctor if:  You get a migraine headache that is different or worse than others you have had.  You have more than 15 headache days in one month. Get help right away if:  Your migraine headache gets very bad.  Your migraine headache lasts longer than 72 hours.  You have a fever.  You have a stiff neck.  You have trouble seeing.  Your muscles feel weak or like you cannot control them.  You start to lose your balance a lot.  You start to have trouble walking.  You pass out (faint).  You have a seizure. Summary  A migraine headache is a very strong throbbing pain on one side or both sides of your head. These headaches can also cause other symptoms.  This condition may be treated with medicines and changes to your lifestyle.  Keep a journal to find out what may bring on your migraine headaches.  Contact a doctor if you get a migraine headache that is different or worse than others you have had.  Contact your doctor if you  have more than 15 headache days in a month. This information is not intended to replace advice given to you by your health care provider. Make sure you discuss any questions you have with your health care provider. Document Released: 11/07/2007 Document Revised: 05/22/2018 Document Reviewed: 03/12/2018 Elsevier Patient Education  2020 Else[employer] Inc. Topiramate tablets What is this medicine? TOPIRAMATE (toe PYRE a mate) is used to treat seizures in  adults or children with epilepsy. It is also used for the prevention of migraine headaches. This medicine may be used for other purposes; ask your health care provider or pharmacist if you have questions. COMMON BRAND NAME(S): Topamax, Topiragen What should I tell my health care provider before I take this medicine? They need to know if you have any of these conditions:  bleeding disorders  cirrhosis of the liver or liver disease  diarrhea  glaucoma  kidney stones or kidney disease  low blood counts, like low white cell, platelet, or red cell counts  lung disease like asthma, obstructive pulmonary disease, emphysema  metabolic acidosis  on a ketogenic diet  schedule for surgery or a procedure  suicidal thoughts, plans, or attempt; a previous suicide attempt by you or a family member  an unusual or allergic reaction to topiramate, other medicines, foods, dyes, or preservatives  pregnant or trying to get pregnant  breast-feeding How should I use this medicine? Take this medicine by mouth with a glass of water. Follow the directions on the prescription label. Do not crush or chew. You may take this medicine with meals. Take your medicine at regular intervals. Do not take it more often than directed. Talk to your pediatrician regarding the use of this medicine in children. Special care may be needed. While this drug may be prescribed for children as young as 60 years of age for selected conditions, precautions do apply. Overdosage: If  you think you have taken too much of this medicine contact a poison control center or emergency room at once. NOTE: This medicine is only for you. Do not share this medicine with others. What if I miss a dose? If you miss a dose, take it as soon as you can. If your next dose is to be taken in less than 6 hours, then do not take the missed dose. Take the next dose at your regular time. Do not take double or extra doses. What may interact with this medicine? Do not take this medicine with any of the following medications:  probenecid This medicine may also interact with the following medications:  acetazolamide  alcohol  amitriptyline  aspirin and aspirin-like medicines  birth control pills  certain medicines for depression  certain medicines for seizures  certain medicines that treat or prevent blood clots like warfarin, enoxaparin, dalteparin, apixaban, dabigatran, and rivaroxaban  digoxin  hydrochlorothiazide  lithium  medicines for pain, sleep, or muscle relaxation  metformin  methazolamide  NSAIDS, medicines for pain and inflammation, like ibuprofen or naproxen  pioglitazone  risperidone This list may not describe all possible interactions. Give your health care provider a list of all the medicines, herbs, non-prescription drugs, or dietary supplements you use. Also tell them if you smoke, drink alcohol, or use illegal drugs. Some items may interact with your medicine. What should I watch for while using this medicine? Visit your doctor or health care professional for regular checks on your progress. Do not stop taking this medicine suddenly. This increases the risk of seizures if you are using this medicine to control epilepsy. Wear a medical identification bracelet or chain to say you have epilepsy or seizures, and carry a card that lists all your medicines. This medicine can decrease sweating and increase your body temperature. Watch for signs of deceased sweating  or fever, especially in children. Avoid extreme heat, hot baths, and saunas. Be careful about exercising, especially in hot weather. Contact your health care provider right away if you notice  a fever or decrease in sweating. You should drink plenty of fluids while taking this medicine. If you have had kidney stones in the past, this will help to reduce your chances of forming kidney stones. If you have stomach pain, with nausea or vomiting and yellowing of your eyes or skin, call your doctor immediately. You may get drowsy, dizzy, or have blurred vision. Do not drive, use machinery, or do anything that needs mental alertness until you know how this medicine affects you. To reduce dizziness, do not sit or stand up quickly, especially if you are an older patient. Alcohol can increase drowsiness and dizziness. Avoid alcoholic drinks. If you notice blurred vision, eye pain, or other eye problems, seek medical attention at once for an eye exam. The use of this medicine may increase the chance of suicidal thoughts or actions. Pay special attention to how you are responding while on this medicine. Any worsening of mood, or thoughts of suicide or dying should be reported to your health care professional right away. This medicine may increase the chance of developing metabolic acidosis. If left untreated, this can cause kidney stones, bone disease, or slowed growth in children. Symptoms include breathing fast, fatigue, loss of appetite, irregular heartbeat, or loss of consciousness. Call your doctor immediately if you experience any of these side effects. Also, tell your doctor about any surgery you plan on having while taking this medicine since this may increase your risk for metabolic acidosis. Birth control pills may not work properly while you are taking this medicine. Talk to your doctor about using an extra method of birth control. Women who become pregnant while using this medicine may enroll in the [place]  [employer] Antiepileptic Drug Pregnancy Registry by calling ([phone]. This registry collects information about the safety of antiepileptic drug use during pregnancy. What side effects may I notice from receiving this medicine? Side effects that you should report to your doctor or health care professional as soon as possible:  allergic reactions like skin rash, itching or hives, swelling of the face, lips, or tongue  decreased sweating and/or rise in body temperature  depression  difficulty breathing, fast or irregular breathing patterns  difficulty speaking  difficulty walking or controlling muscle movements  hearing impairment  redness, blistering, peeling or loosening of the skin, including inside the mouth  tingling, pain or numbness in the hands or feet  unusual bleeding or bruising  unusually weak or tired  worsening of mood, thoughts or actions of suicide or dying Side effects that usually do not require medical attention (report to your doctor or health care professional if they continue or are bothersome):  altered taste  back pain, joint or muscle aches and pains  diarrhea, or constipation  headache  loss of appetite  nausea  stomach upset, indigestion  tremors This list may not describe all possible side effects. Call your doctor for medical advice about side effects. You may report side effects to FDA at 1-800-FDA-1088. Where should I keep my medicine? Keep out of the reach of children. Store at room temperature between 15 and 30 degrees C (59 and 86 degrees F) in a tightly closed container. Protect from moisture. Throw away any unused medicine after the expiration date. NOTE: This sheet is a summary. It may not cover all possible information. If you have questions about this medicine, talk to your doctor, pharmacist, or health care provider.  2020 Elsevier/Gold Standard (2013-02-01 23:17:57)

## 2018-10-30 NOTE — Progress Notes (Signed)
[facility]   Established Patient Office Visit  Subjective:  Patient ID: Hector Wilcox, male    DOB: 02/14/1975  Age: 43 y.o. MRN: [id number]  CC:  Chief Complaint  Patient presents with  . Follow-up    6 month follow up, blurred vision, doulde vision, having headache,pressure in head     HPI Hector Wilcox is a 43 year old male who presents for Follow Up today.   Past Medical History:  Diagnosis Date  . Acid reflux   . Constipation   . Hypocalcemia   . Muscle spasm   . Sickle cell trait (HCC)   . 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 He has history of Stroke 02/2018. He has migraines daily, which he takes Aleve for minor relief, but migraine still lingers. Migraine is occurs near the temporal area of his head and pressure is increased when he bends over. He reports minimal left sided weakness. He states that he has not had an alcoholic beverage since 1 month, when before he was drinking many drinks on a daily basis. He is currently smoking 1 pack and 1/2 of cigarettes daily. He continues to have c/o constipation, which he uses a stool softener for relief.  He denies fevers, chills, fatigue, recent infections, weight loss, and night sweats. He has not had any falls. No chest pain, heart palpitations, cough and shortness of breath reported. No reports of GI problems such as nausea, vomiting, and diarrhea.  He has no reports of blood in stools, dysuria and hematuria. No depression or anxiety reported today.  Past Surgical History:  Procedure Laterality Date  . TEE WITHOUT CARDIOVERSION N/A 03/19/2018   Procedure: TRANSESOPHAGEAL ECHOCARDIOGRAM (TEE);  Surgeon: Lewayne Bunting, MD;  Location: [facility] ENDOSCOPY;  Service: Cardiovascular;  Laterality: N/A;    Family History  Problem Relation Age of  Onset  . Heart failure Mother     Social History   Socioeconomic History  . Marital status: Single    Spouse name: Not on file  . Number of children: Not on file  . Years of education: Not on file  . Highest education level: Not on file  Occupational History  . Not on file  Social Needs  . Financial resource strain: Not on file  . Food insecurity    Worry: Not on file    Inability: Not on file  . Transportation needs    Medical: Not on file    Non-medical: Not on file  Tobacco Use  . Smoking status: Former Smoker    Packs/day: 0.25    Years: 13.00    Pack years: 3.25    Types: Cigars  . Smokeless tobacco: Never Used  Substance and Sexual Activity  . Alcohol use: Yes    Alcohol/week: 4.0 standard drinks    Types: 4 Cans of beer per week  . Drug use: No  . Sexual activity: Yes    Birth control/protection: Condom  Lifestyle  . Physical activity    Days per week: Not on file    Minutes per session: Not on file  . Stress: Not on file  Relationships  . Social Musician on phone: Not on file    Gets together: Not on file    Attends religious service: Not on file    Active member  of club or organization: Not on file    Attends meetings of clubs or organizations: Not on file    Relationship status: Not on file  . Intimate partner violence    Fear of current or ex partner: Not on file    Emotionally abused: Not on file    Physically abused: Not on file    Forced sexual activity: Not on file  Other Topics Concern  . Not on file  Social History Narrative  . Not on file    Outpatient Medications Prior to Visit  Medication Sig Dispense Refill  . aspirin EC 81 MG tablet Take 1 tablet (81 mg total) by mouth daily. Take Plavix 75 mg daily + Aspirin 81 mg with Food Daily For 3 weeks , then STOP Plavix and Take Aspirin only 30 tablet 11  . atorvastatin (LIPITOR) 40 MG tablet Take 1 tablet (40 mg total) by mouth every evening. 30 tablet 5  . bismuth  subsalicylate (PEPTO-BISMOL) 262 MG/15ML suspension Take 30 mLs by mouth 4 (four) times daily -  before meals and at bedtime. 1700 mL 0  . cetirizine (ZYRTEC) 10 MG tablet Take 10 mg by mouth daily as needed for allergies.    Marland Kitchen. clopidogrel (PLAVIX) 75 MG tablet Take 1 tablet (75 mg total) by mouth daily. Take Plavix 75 mg daily + Aspirin 81 mg with Food Daily For 3 weeks , then STOP Plavix and Take Aspirin only 21 tablet 0  . folic acid (FOLVITE) 1 MG tablet Take 1 tablet (1 mg total) by mouth daily. 30 tablet 5  . meclizine (ANTIVERT) 25 MG tablet Take 1 tablet (25 mg total) by mouth 3 (three) times daily as needed for dizziness. 30 tablet 2  . Melatonin 1 MG TABS Take 1 mg by mouth daily as needed (sleep).     . Multiple Vitamin (MULTIVITAMIN WITH MINERALS) TABS tablet Take 1 tablet by mouth daily. 30 tablet 5  . sucralfate (CARAFATE) 1 g tablet Take 1 tablet (1 g total) by mouth 4 (four) times daily -  with meals and at bedtime. 120 tablet 2  . thiamine 100 MG tablet Take 1 tablet (100 mg total) by mouth daily. 30 tablet 5  . pantoprazole (PROTONIX) 40 MG tablet Take 1 tablet (40 mg total) by mouth daily. 30 tablet 3  . sildenafil (VIAGRA) 50 MG tablet Take 1 tablet (50 mg total) by mouth daily as needed for erectile dysfunction. 10 tablet 0  . cyclobenzaprine (FLEXERIL) 10 MG tablet Take 1 tablet (10 mg total) by mouth 3 (three) times daily as needed for muscle spasms. (Patient not taking: Reported on 10/30/2018) 30 tablet 2  . fluticasone (FLONASE) 50 MCG/ACT nasal spray Place 2 sprays into both nostrils daily. (Patient not taking: Reported on 06/02/2018) 1 g 0   No facility-administered medications prior to visit.     Allergies  Allergen Reactions  . Other Swelling    Tomatoes Onions  *swelling in the mouth*  . Tramadol Swelling    lips    ROS Review of Systems  Constitutional: Negative.   HENT: Negative.   Eyes: Negative.   Respiratory: Negative.   Cardiovascular: Negative.    Gastrointestinal: Negative.   Endocrine: Negative.   Genitourinary: Negative.   Musculoskeletal: Negative.   Skin: Negative.   Allergic/Immunologic: Negative.   Neurological: Positive for dizziness (occasional ), weakness (left-sided weakness) and headaches (frequent migraines).  Hematological: Negative.   Psychiatric/Behavioral: Negative.       Objective:  Physical Exam  Constitutional: He is oriented to person, place, and time. He appears well-developed and well-nourished.  HENT:  Head: Normocephalic and atraumatic.  Eyes: Conjunctivae are normal.  Neck: Normal range of motion. Neck supple.  Cardiovascular: Normal rate, regular rhythm, normal heart sounds and intact distal pulses.  Pulmonary/Chest: Effort normal and breath sounds normal.  Abdominal: Soft. Bowel sounds are normal.  Musculoskeletal: Normal range of motion.  Neurological: He is alert and oriented to person, place, and time. He has normal reflexes.  Left-sided weakness r/t recent Stroke.   Skin: Skin is warm and dry.  Psychiatric: He has a normal mood and affect. His behavior is normal. Judgment and thought content normal.  Nursing note and vitals reviewed.   BP 130/77 (BP Location: Left Arm, Patient Position: Sitting, Cuff Size: Normal)   Pulse 90   Temp 98 F (36.7 C) (Oral)   Ht 5\' 9"  (1.753 m)   Wt 176 lb 9.6 oz (80.1 kg)   SpO2 98%   BMI 26.08 kg/m  Wt Readings from Last 3 Encounters:  10/30/18 176 lb 9.6 oz (80.1 kg)  06/02/18 180 lb (81.6 kg)  04/29/18 186 lb (84.4 kg)     Health Maintenance Due  Topic Date Due  . TETANUS/TDAP  09/11/1996  . INFLUENZA VACCINE  09/12/2018    There are no preventive care reminders to display for this patient.  Lab Results  Component Value Date   TSH 1.570 04/07/2018   Lab Results  Component Value Date   WBC 6.7 06/02/2018   HGB 15.6 06/02/2018   HCT 45.9 06/02/2018   MCV 85.6 06/02/2018   PLT 252 06/02/2018   Lab Results  Component Value Date    NA 140 06/02/2018   K 4.0 06/02/2018   CO2 25 06/02/2018   GLUCOSE 116 (H) 06/02/2018   BUN 10 06/02/2018   CREATININE 0.93 06/02/2018   BILITOT 0.8 06/02/2018   ALKPHOS 76 06/02/2018   AST 37 06/02/2018   ALT 49 (H) 06/02/2018   PROT 6.9 06/02/2018   ALBUMIN 4.1 06/02/2018   CALCIUM 9.6 06/02/2018   ANIONGAP 10 06/02/2018   Lab Results  Component Value Date   CHOL 84 (L) 04/07/2018   Lab Results  Component Value Date   HDL 34 (L) 04/07/2018   Lab Results  Component Value Date   LDLCALC 41 04/07/2018   Lab Results  Component Value Date   TRIG 46 04/07/2018   Lab Results  Component Value Date   CHOLHDL 2.5 04/07/2018   Lab Results  Component Value Date   HGBA1C 5.9 (H) 03/18/2018      Assessment & Plan:   1. History of stroke He is experiencing signs and symptoms dizziness, visual changes and migraines lately. Basic Neurologic Exam is negative for any abnormalities today. We will refer him to Neurology for further evaluation.  - Ambulatory referral to Neurology  2. Dizziness - Ambulatory referral to Neurology  3. Blurry vision, bilateral - Ambulatory referral to Neurology  4. Left-sided weakness Stable today. Basic neurological exam is negative today.  - Ambulatory referral to Neurology  5. Migraine without status migrainosus, not intractable, unspecified migraine type We will initiate Topiramate today.  - Ambulatory referral to Neurology - topiramate (TOPAMAX) 50 MG tablet; Take 1 tablet (50 mg total) by mouth 2 (two) times daily.  Dispense: 60 tablet; Refill: 2  6. Gastroesophageal reflux disease without esophagitis - pantoprazole (PROTONIX) 40 MG tablet; Take 1 tablet (40 mg total) by mouth daily.  Dispense: 30 tablet; Refill: 3  7. Insomnia, unspecified type Melatonin is not effective. We will initiate Trazodone today.  - traZODone (DESYREL) 50 MG tablet; Take 0.5-1 tablets (25-50 mg total) by mouth at bedtime as needed for sleep.  Dispense: 30  tablet; Refill: 3  8. Erectile dysfunction, unspecified erectile dysfunction type We will increase Sildenafil to 100 mg today.  - sildenafil (VIAGRA) 100 MG tablet; Take 1 tablet (100 mg total) by mouth daily as needed for erectile dysfunction.  Dispense: 10 tablet; Refill: 3  9. Tobacco use He denies smoking cessation at this time.   10. Alcohol use  11. Elevated glucose - HgB A1c - Glucose (CBG), Fasting  12. Healthcare maintenance - POCT Urinalysis Dipstick  13. Follow up He will follow up in 1 month.   Meds ordered this encounter  Medications  . pantoprazole (PROTONIX) 40 MG tablet    Sig: Take 1 tablet (40 mg total) by mouth daily.    Dispense:  30 tablet    Refill:  3  . DISCONTD: sildenafil (VIAGRA) 100 MG tablet    Sig: Take 1 tablet (100 mg total) by mouth daily as needed for erectile dysfunction.    Dispense:  10 tablet    Refill:  3  . DISCONTD: topiramate (TOPAMAX) 50 MG tablet    Sig: Take 1 tablet (50 mg total) by mouth 2 (two) times daily.    Dispense:  60 tablet    Refill:  2  . DISCONTD: traZODone (DESYREL) 50 MG tablet    Sig: Take 0.5-1 tablets (25-50 mg total) by mouth at bedtime as needed for sleep.    Dispense:  30 tablet    Refill:  3  . sildenafil (VIAGRA) 100 MG tablet    Sig: Take 1 tablet (100 mg total) by mouth daily as needed for erectile dysfunction.    Dispense:  10 tablet    Refill:  3  . topiramate (TOPAMAX) 50 MG tablet    Sig: Take 1 tablet (50 mg total) by mouth 2 (two) times daily.    Dispense:  60 tablet    Refill:  2  . traZODone (DESYREL) 50 MG tablet    Sig: Take 0.5-1 tablets (25-50 mg total) by mouth at bedtime as needed for sleep.    Dispense:  30 tablet    Refill:  3    Orders Placed This Encounter  Procedures  . Ambulatory referral to Neurology  . POCT Urinalysis Dipstick  . HgB A1c  . Glucose (CBG), Fasting     Referral Orders     Ambulatory referral to Neurology    Raliegh Ip,  MSN, FNP-BC [facility]  Health Patient [facility] [address] [place], [place] [address] [phone]  Problem List Items Addressed This Visit      Digestive   Gastroesophageal reflux disease without esophagitis   Relevant Medications   pantoprazole (PROTONIX) 40 MG tablet     Nervous and Auditory   Left-sided weakness   Relevant Orders   Ambulatory referral to Neurology     Other   Dizziness   Relevant Orders   Ambulatory referral to Neurology   Erectile dysfunction   Relevant Medications   sildenafil (VIAGRA) 100 MG tablet    Other Visit Diagnoses    History of stroke    -  Primary   Relevant Orders   Ambulatory referral to Neurology   Blurry vision, bilateral  Relevant Orders   Ambulatory referral to Neurology   Migraine without status migrainosus, not intractable, unspecified migraine type       Relevant Medications   sildenafil (VIAGRA) 100 MG tablet   topiramate (TOPAMAX) 50 MG tablet   traZODone (DESYREL) 50 MG tablet   Other Relevant Orders   Ambulatory referral to Neurology   Insomnia, unspecified type       Relevant Medications   traZODone (DESYREL) 50 MG tablet   Tobacco use       Alcohol use       Elevated glucose       Relevant Orders   HgB A1c   Glucose (CBG), Fasting   Healthcare maintenance       Relevant Orders   POCT Urinalysis Dipstick (Completed)   Follow up          Meds ordered this encounter  Medications  . pantoprazole (PROTONIX) 40 MG tablet    Sig: Take 1 tablet (40 mg total) by mouth daily.    Dispense:  30 tablet    Refill:  3  . DISCONTD: sildenafil (VIAGRA) 100 MG tablet    Sig: Take 1 tablet (100 mg total) by mouth daily as needed for erectile dysfunction.    Dispense:  10 tablet    Refill:  3  . DISCONTD: topiramate (TOPAMAX) 50 MG tablet    Sig: Take 1 tablet (50 mg total) by mouth 2 (two) times daily.    Dispense:  60 tablet    Refill:  2  . DISCONTD: traZODone  (DESYREL) 50 MG tablet    Sig: Take 0.5-1 tablets (25-50 mg total) by mouth at bedtime as needed for sleep.    Dispense:  30 tablet    Refill:  3  . sildenafil (VIAGRA) 100 MG tablet    Sig: Take 1 tablet (100 mg total) by mouth daily as needed for erectile dysfunction.    Dispense:  10 tablet    Refill:  3  . topiramate (TOPAMAX) 50 MG tablet    Sig: Take 1 tablet (50 mg total) by mouth 2 (two) times daily.    Dispense:  60 tablet    Refill:  2  . traZODone (DESYREL) 50 MG tablet    Sig: Take 0.5-1 tablets (25-50 mg total) by mouth at bedtime as needed for sleep.    Dispense:  30 tablet    Refill:  3    Follow-up: Return in about 1 month (around 11/29/2018).    Kallie LocksNatalie M Elexus Barman, FNP

## 2018-11-12 DIAGNOSIS — K0889 Other specified disorders of teeth and supporting structures: Secondary | ICD-10-CM

## 2018-11-12 DIAGNOSIS — K047 Periapical abscess without sinus: Secondary | ICD-10-CM

## 2018-11-12 HISTORY — DX: Other specified disorders of teeth and supporting structures: K08.89

## 2018-11-12 HISTORY — DX: Periapical abscess without sinus: K04.7

## 2018-11-30 ENCOUNTER — Ambulatory Visit: Payer: Self-pay | Admitting: Family Medicine

## 2018-12-10 ENCOUNTER — Other Ambulatory Visit: Payer: Self-pay | Admitting: Family Medicine

## 2018-12-10 ENCOUNTER — Encounter: Payer: Self-pay | Admitting: Family Medicine

## 2018-12-10 DIAGNOSIS — K047 Periapical abscess without sinus: Secondary | ICD-10-CM

## 2018-12-10 DIAGNOSIS — K0889 Other specified disorders of teeth and supporting structures: Secondary | ICD-10-CM

## 2018-12-10 MED ORDER — IBUPROFEN 800 MG PO TABS: 800.0000 mg | ORAL_TABLET | Freq: Three times a day (TID) | ORAL | 3 refills | Status: DC | PRN

## 2018-12-10 MED ORDER — AMOXICILLIN-POT CLAVULANATE 875-125 MG PO TABS: 1.0000 | ORAL_TABLET | Freq: Two times a day (BID) | ORAL | 0 refills | Status: DC

## 2018-12-14 MED FILL — AMOX-CLAV 875-125 MG TABLET: 875-125 | 10 days supply | Qty: 20 | Fill #0

## 2018-12-14 MED FILL — IBUPROFEN 800 MG TABLET: 800 | 10 days supply | Qty: 30 | Fill #0

## 2018-12-20 ENCOUNTER — Other Ambulatory Visit: Payer: Self-pay

## 2018-12-20 ENCOUNTER — Emergency Department (HOSPITAL_COMMUNITY)
Admission: EM | Admit: 2018-12-20 | Discharge: 2018-12-20 | Disposition: A | Discharge status: Home / Self Care | Payer: Self-pay | Attending: Emergency Medicine | Admitting: Emergency Medicine

## 2018-12-20 ENCOUNTER — Encounter (HOSPITAL_COMMUNITY): Payer: Self-pay

## 2018-12-20 DIAGNOSIS — H1031 Unspecified acute conjunctivitis, right eye: Secondary | ICD-10-CM | POA: Insufficient documentation

## 2018-12-20 DIAGNOSIS — Z8673 Personal history of transient ischemic attack (TIA), and cerebral infarction without residual deficits: Secondary | ICD-10-CM | POA: Insufficient documentation

## 2018-12-20 DIAGNOSIS — Z87891 Personal history of nicotine dependence: Secondary | ICD-10-CM | POA: Insufficient documentation

## 2018-12-20 DIAGNOSIS — Z7982 Long term (current) use of aspirin: Secondary | ICD-10-CM | POA: Insufficient documentation

## 2018-12-20 DIAGNOSIS — Z7901 Long term (current) use of anticoagulants: Secondary | ICD-10-CM | POA: Insufficient documentation

## 2018-12-20 DIAGNOSIS — Z79899 Other long term (current) drug therapy: Secondary | ICD-10-CM | POA: Insufficient documentation

## 2018-12-20 MED ORDER — TETRACAINE HCL 0.5 % OP SOLN
2.0000 [drp] | Freq: Once | OPHTHALMIC | Status: AC
  Administered 2018-12-20: 17:00:00 2 [drp] via OPHTHALMIC
  Filled 2018-12-20: qty 4

## 2018-12-20 MED ORDER — POLYMYXIN B-TRIMETHOPRIM 10000-0.1 UNIT/ML-% OP SOLN: 1.0000 [drp] | OPHTHALMIC | 0 refills | Status: DC

## 2018-12-20 MED ORDER — FLUORESCEIN SODIUM 1 MG OP STRP
1.0000 | ORAL_STRIP | Freq: Once | OPHTHALMIC | Status: AC
  Administered 2018-12-20: 17:00:00 1 via OPHTHALMIC
  Filled 2018-12-20: qty 1

## 2018-12-20 MED ORDER — OLOPATADINE HCL 0.1 % OP SOLN: 1.0000 [drp] | Freq: Two times a day (BID) | OPHTHALMIC | 12 refills | Status: DC

## 2018-12-20 NOTE — ED Provider Notes (Signed)
[facility] Provider Note   CSN: [id number] Arrival date & time: 12/20/18  1532     History   Chief Complaint Chief Complaint  Patient presents with  . eye redness/ swelling    HPI Hector Wilcox is a 43 y.o. male.     HPI Hector GustinMario D Wilcox is a 43 y.o. male presents to emergency department with complaint of right eye pain.  Patient states he has had redness, swelling, pain to the right eye for about 3 to 4 days.  He states that he is draining green purulent drainage.  He states at times it is painful but mainly is just uncomfortable.  No itching.  No injuries.  He has tried Benadryl because he thought it was allergies but it did not help.  He states his left eye was draining some to but left eye improved, while the right eye continues to drain and feel uncomfortable.  Patient does not wear contacts.  He does wear glasses.  He denies any injuries.  He does work in a Social research officer, [employer].  He denies any other URI symptoms.  He denies any sick contacts.  Past Medical History:  Diagnosis Date  . Acid reflux   . Constipation   . Hypocalcemia   . Muscle spasm   . Sickle cell trait (HCC)   . 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 Tooth abscess 11/2018  . Tooth pain 11/2018    Patient Active Problem List   Diagnosis Date Noted  . Blurry vision, bilateral 10/30/2018  . Migraine without status migrainosus, not intractable 10/30/2018  . Insomnia 10/30/2018  . Tobacco use 10/30/2018  . Alcohol use 10/30/2018  . Acute leg pain, left 06/02/2018  . Stroke (cerebrum) (HCC)   . Erectile dysfunction 04/29/2018  . Left-sided weakness 04/29/2018  . Dizziness 04/29/2018  . Fatigue 04/29/2018  . Gastroesophageal reflux disease without esophagitis 04/29/2018  . Screening for sickle-cell disease or trait 04/01/2018  . Acute right PCA stroke (HCC) 03/17/2018    Past Surgical History:  Procedure Laterality Date  . TEE WITHOUT CARDIOVERSION N/A 03/19/2018   Procedure: TRANSESOPHAGEAL ECHOCARDIOGRAM (TEE);  Surgeon: Lewayne Buntingrenshaw, Brian S, MD;  [facility];  Service: Cardiovascular;  Laterality: N/A;        Home Medications    Prior to Admission medications   Medication Sig Start Date End Date Taking? Authorizing Provider  amoxicillin-clavulanate (AUGMENTIN) 875-125 MG tablet Take 1 tablet by mouth 2 (two) times daily for 10 days. 12/10/18 12/20/18  Kallie LocksStroud, Natalie M, FNP  aspirin EC 81 MG tablet Take 1 tablet (81 mg total) by mouth daily. Take Plavix 75 mg daily + Aspirin 81 mg with Food Daily For 3 weeks , then STOP Plavix and Take Aspirin only 03/20/18   Emokpae, Courage, MD  atorvastatin (LIPITOR) 40 MG tablet Take 1 tablet (40 mg total) by mouth every evening. 03/20/18   Shon HaleEmokpae, Courage, MD  bismuth subsalicylate (PEPTO-BISMOL) 262 MG/15ML suspension Take 30 mLs by mouth 4 (four) times daily -  before meals and at bedtime. 01/13/15   Lyndal PulleyKnott, Daniel, MD  cetirizine (ZYRTEC) 10 MG tablet Take 10 mg by mouth daily as needed for allergies.    [provider]  clopidogrel (PLAVIX) 75 MG tablet Take 1 tablet (75 mg total) by mouth daily. Take Plavix 75 mg daily + Aspirin 81 mg with Food Daily For 3 weeks , then STOP Plavix and Take Aspirin only 03/21/18   Shon HaleEmokpae, Courage, MD  cyclobenzaprine (FLEXERIL) 10  MG tablet Take 1 tablet (10 mg total) by mouth 3 (three) times daily as needed for muscle spasms. Patient not taking: Reported on 10/30/2018 06/10/18   Kallie Locks, FNP  fluticasone [facility]) 50 MCG/ACT nasal spray Place 2 sprays into both nostrils daily. Patient not taking: Reported on 06/02/2018 09/27/16   Belinda Fisher, PA-C  folic acid (FOLVITE) 1 MG tablet Take 1 tablet (1 mg total) by mouth daily. 03/21/18   Shon Hale, MD  ibuprofen (ADVIL) 800 MG tablet Take 1 tablet (800 mg total) by mouth every 8 (eight) hours as needed. 12/10/18   Kallie Locks, FNP  meclizine (ANTIVERT) 25 MG tablet Take 1 tablet (25 mg total) by mouth 3  (three) times daily as needed for dizziness. 04/29/18   Kallie Locks, FNP  Melatonin 1 MG TABS Take 1 mg by mouth daily as needed (sleep).     [provider]  Multiple Vitamin (MULTIVITAMIN WITH MINERALS) TABS tablet Take 1 tablet by mouth daily. 03/21/18   Shon Hale, MD  pantoprazole (PROTONIX) 40 MG tablet Take 1 tablet (40 mg total) by mouth daily. 10/30/18   Kallie Locks, FNP  sildenafil (VIAGRA) 100 MG tablet Take 1 tablet (100 mg total) by mouth daily as needed for erectile dysfunction. 10/30/18   Kallie Locks, FNP  sucralfate (CARAFATE) 1 g tablet Take 1 tablet (1 g total) by mouth 4 (four) times daily -  with meals and at bedtime. 06/10/18   Kallie Locks, FNP  thiamine 100 MG tablet Take 1 tablet (100 mg total) by mouth daily. 03/21/18   Shon Hale, MD  topiramate (TOPAMAX) 50 MG tablet Take 1 tablet (50 mg total) by mouth 2 (two) times daily. 10/30/18   Kallie Locks, FNP  traZODone (DESYREL) 50 MG tablet Take 0.5-1 tablets (25-50 mg total) by mouth at bedtime as needed for sleep. 10/30/18   Kallie Locks, FNP    Family History Family History  Problem Relation Age of Onset  . Heart failure Mother     Social History Social History   Tobacco Use  . Smoking status: Former Smoker    Packs/day: 0.25    Years: 13.00    Pack years: 3.25    Types: Cigars  . Smokeless tobacco: Never Used  Substance Use Topics  . Alcohol use: Yes    Alcohol/week: 4.0 standard drinks    Types: 4 Cans of beer per week  . Drug use: No     Allergies   Other and Tramadol   Review of Systems Review of Systems  Constitutional: Negative for chills and fever.  HENT: Negative for congestion, rhinorrhea and sore throat.   Eyes: Positive for pain, discharge and redness. Negative for photophobia, itching and visual disturbance.  Skin: Negative for rash.  Neurological: Negative for dizziness and headaches.     Physical Exam Updated Vital Signs BP 137/89 (BP  Location: Right Arm)   Pulse (!) 116   Temp 98.7 F (37.1 C) (Oral)   Resp 16   SpO2 99%   Physical Exam Vitals signs and nursing note reviewed.  Constitutional:      General: He is not in acute distress.    Appearance: He is well-developed.  Eyes:     General: Lids are normal. Lids are everted, no foreign bodies appreciated.     Conjunctiva/sclera:     Right eye: Right conjunctiva is injected. Chemosis present. No exudate or hemorrhage.    Left eye: Left  conjunctiva is not injected. No chemosis, exudate or hemorrhage.    Pupils: Pupils are equal, round, and reactive to light.     Right eye: No corneal abrasion or fluorescein uptake.     Funduscopic exam:    Right eye: No hemorrhage.     Slit lamp exam:    Right eye: No corneal ulcer or photophobia.  Neck:     Musculoskeletal: Neck supple.  Cardiovascular:     Rate and Rhythm: Normal rate.  Pulmonary:     Effort: No respiratory distress.  Abdominal:     General: There is no distension.  Skin:    General: Skin is warm and dry.      ED Treatments / Results  Labs (all labs ordered are listed, but only abnormal results are displayed) Labs Reviewed - No data to display  EKG None  Radiology No results found.  Procedures Procedures (including critical care time)  Medications Ordered in ED Medications  fluorescein ophthalmic strip 1 strip (has no administration in time range)  tetracaine (PONTOCAINE) 0.5 % ophthalmic solution 2 drop (has no administration in time range)     Initial Impression / Assessment and Plan / ED Course  I have reviewed the triage vital signs and the nursing notes.  Pertinent labs & imaging results that were available during my care of the patient were reviewed by me and considered in my medical decision making (see chart for details).        Patient with a right eye erythema, reported purulent drainage, swelling of the conjunctiva.  Fluorescein stain negative.  Visual acuity is 10/15  right, 10/25 left.  Exam was consistent with viral versus bacterial versus allergic conjunctivitis.  Will treat antibiotic drops and Patanol drops.  Discussed cool compresses, no rubbing or scratching the eye, follow-up as needed.  Return if worsening symptoms.  Vitals:   12/20/18 1536 12/20/18 1750  BP: 137/89 (!) 141/96  Pulse: (!) 116 70  Resp: 16 16  Temp: 98.7 F (37.1 C)   TempSrc: Oral   SpO2: 99% 99%      Final Clinical Impressions(s) / ED Diagnoses   Final diagnoses:  Acute conjunctivitis of right eye, unspecified acute conjunctivitis type    ED Discharge Orders         Ordered    olopatadine (PATANOL) 0.1 % ophthalmic solution  2 times daily     12/20/18 1720    trimethoprim-polymyxin b (POLYTRIM) ophthalmic solution  Every 4 hours     12/20/18 1720           Jeannett Senior, PA-C 12/20/18 2053    Davonna Belling, MD 12/21/18 2324

## 2018-12-20 NOTE — Discharge Instructions (Signed)
The swelling in your eye is called chemosis, it is from irritation which is due to the infection.  Use both drops as prescribed.  Avoid rubbing the eye.  Try cool compresses.  Follow-up with your doctor as needed.  Return if worsening symptoms

## 2018-12-20 NOTE — ED Notes (Signed)
Patient verbalizes understanding of discharge instructions. Opportunity for questioning and answers were provided. Armband removed by staff, pt discharged from ED.  

## 2018-12-20 NOTE — ED Triage Notes (Signed)
Patient complains of right eye irritation and swelling to inner eye with drainage x 4 days, denies injury

## 2018-12-21 MED FILL — OLOPATADINE HCL 0.1% EYE DR: 0.1 | 7 days supply | Qty: 5 | Fill #0 | Status: TO

## 2018-12-21 MED FILL — POLYMYXIN B-TMP EYE DROPS: 10000-0.1 | 10 days supply | Qty: 10 | Fill #0 | Status: TO

## 2018-12-22 MED FILL — OLOPATADINE HCL 0.1 % SOLN: 0.1 | 7 days supply | Qty: 5 | Fill #0

## 2018-12-22 MED FILL — POLYMYXIN B/TMP EYE DROPS: 10000-0.1 | 10 days supply | Qty: 10 | Fill #0

## 2019-01-19 ENCOUNTER — Other Ambulatory Visit: Payer: Self-pay

## 2019-01-19 ENCOUNTER — Encounter: Payer: Self-pay | Admitting: Family Medicine

## 2019-01-19 ENCOUNTER — Ambulatory Visit (INDEPENDENT_AMBULATORY_CARE_PROVIDER_SITE_OTHER): Payer: Self-pay | Admitting: Family Medicine

## 2019-01-19 VITALS — BP 115/70 | HR 98 | Temp 98.6°F | Ht 69.0 in | Wt 270.2 lb

## 2019-01-19 DIAGNOSIS — G43909 Migraine, unspecified, not intractable, without status migrainosus: Secondary | ICD-10-CM

## 2019-01-19 DIAGNOSIS — Z8673 Personal history of transient ischemic attack (TIA), and cerebral infarction without residual deficits: Secondary | ICD-10-CM

## 2019-01-19 DIAGNOSIS — K0889 Other specified disorders of teeth and supporting structures: Secondary | ICD-10-CM

## 2019-01-19 DIAGNOSIS — R531 Weakness: Secondary | ICD-10-CM

## 2019-01-19 DIAGNOSIS — Z09 Encounter for follow-up examination after completed treatment for conditions other than malignant neoplasm: Secondary | ICD-10-CM

## 2019-01-19 DIAGNOSIS — R42 Dizziness and giddiness: Secondary | ICD-10-CM

## 2019-01-19 DIAGNOSIS — H538 Other visual disturbances: Secondary | ICD-10-CM

## 2019-01-19 DIAGNOSIS — R7309 Other abnormal glucose: Secondary | ICD-10-CM

## 2019-01-19 DIAGNOSIS — Z202 Contact with and (suspected) exposure to infections with a predominantly sexual mode of transmission: Secondary | ICD-10-CM

## 2019-01-19 DIAGNOSIS — K219 Gastro-esophageal reflux disease without esophagitis: Secondary | ICD-10-CM

## 2019-01-19 DIAGNOSIS — K047 Periapical abscess without sinus: Secondary | ICD-10-CM

## 2019-01-19 LAB — POCT URINALYSIS DIPSTICK
Bilirubin, UA: NEGATIVE
Blood, UA: NEGATIVE
Glucose, UA: NEGATIVE
Ketones, UA: NEGATIVE
Leukocytes, UA: NEGATIVE
Nitrite, UA: NEGATIVE
Protein, UA: NEGATIVE
Spec Grav, UA: >=1.03 — AB (ref 1.010–1.025)
Urobilinogen, UA: 1 E.U./dL
pH, UA: 5.5 (ref 5.0–8.0)

## 2019-01-19 LAB — GLUCOSE, POCT (MANUAL RESULT ENTRY): POC Glucose: 114 mg/dl — AB (ref 70–99)

## 2019-01-19 MED ORDER — AMOXICILLIN-POT CLAVULANATE 875-125 MG PO TABS: 1.0000 | ORAL_TABLET | Freq: Two times a day (BID) | ORAL | 0 refills | Status: AC

## 2019-01-19 MED ORDER — FAMOTIDINE 20 MG PO TABS: 20.0000 mg | ORAL_TABLET | Freq: Two times a day (BID) | ORAL | 6 refills | Status: AC

## 2019-01-19 MED FILL — AMOX-CLAV 875-125 MG TABLET: 875-125 | 10 days supply | Qty: 20 | Fill #0

## 2019-01-19 NOTE — Progress Notes (Signed)
[facility]  Established Patient Office Visit  Subjective:  Patient ID: Hector Wilcox, male    DOB: 06-Aug-1975  Age: 43 y.o. MRN: [id number]  CC:  Chief Complaint  Patient presents with  . Hospitalization Follow-up    2 mth follow up stroke  . Abdominal Pain    constipation    HPI Hector Wilcox is a 43 year old male who presents for Follow Up today.    Past Medical History:  Diagnosis Date  . Acid reflux   . Constipation   . Hypocalcemia   . Muscle spasm   . Sickle cell trait (HCC)   . 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 Tooth abscess 11/2018  . Tooth pain 11/2018   Current Status: Since his last office visit, he continues to have c/o blurry vision intermittently. He reports dizzy spells at this time too, which he states lasts 1-2 days. He states that it is mainly in his left eye. He states that previous Optometrist required $200 co-pay. He also reports slurred speech. He will follow up with [facility]. He is currently trying to get decide which insurance to choose from by the first of the year. He denies fevers, chills, fatigue, recent infections, weight loss, and night sweats. He has not had any falls. No chest pain, heart palpitations, cough and shortness of breath reported. No reports of GI problems such as nausea, vomiting, diarrhea, and constipation. He has no reports of blood in stools, dysuria and hematuria. No depression or anxiety, and denies suicidal ideations, homicidal ideations, or auditory hallucinations. He denies pain today.   Past Surgical History:  Procedure Laterality Date  . TEE WITHOUT CARDIOVERSION N/A 03/19/2018   Procedure: TRANSESOPHAGEAL ECHOCARDIOGRAM (TEE);  Surgeon: Lewayne Bunting, MD;  Location: [facility] ENDOSCOPY;  Service: Cardiovascular;  Laterality: N/A;    Family History  Problem Relation Age of Onset  . Heart failure Mother     Social History   Socioeconomic History  . Marital  status: Single    Spouse name: Not on file  . Number of children: Not on file  . Years of education: Not on file  . Highest education level: Not on file  Occupational History  . Not on file  Social Needs  . Financial resource strain: Not on file  . Food insecurity    Worry: Not on file    Inability: Not on file  . Transportation needs    Medical: Not on file    Non-medical: Not on file  Tobacco Use  . Smoking status: Former Smoker    Packs/day: 0.25    Years: 13.00    Pack years: 3.25    Types: Cigars  . Smokeless tobacco: Never Used  Substance and Sexual Activity  . Alcohol use: Yes    Alcohol/week: 4.0 standard drinks    Types: 4 Cans of beer per week  . Drug use: No  . Sexual activity: Yes    Birth control/protection: Condom  Lifestyle  . Physical activity    Days per week: Not on file    Minutes per session: Not on file  . Stress: Not on file  Relationships  . Social Musician on phone: Not on file    Gets together: Not on file    Attends religious service: Not on file    Active member of club or organization: Not on file    Attends meetings of clubs or organizations:  Not on file    Relationship status: Not on file  . Intimate partner violence    Fear of current or ex partner: Not on file    Emotionally abused: Not on file    Physically abused: Not on file    Forced sexual activity: Not on file  Other Topics Concern  . Not on file  Social History Narrative  . Not on file    Outpatient Medications Prior to Visit  Medication Sig Dispense Refill  . aspirin EC 81 MG tablet Take 1 tablet (81 mg total) by mouth daily. Take Plavix 75 mg daily + Aspirin 81 mg with Food Daily For 3 weeks , then STOP Plavix and Take Aspirin only 30 tablet 11  . atorvastatin (LIPITOR) 40 MG tablet Take 1 tablet (40 mg total) by mouth every evening. 30 tablet 5  . bismuth subsalicylate (PEPTO-BISMOL) 262 MG/15ML suspension Take 30 mLs by mouth 4 (four) times daily -  before  meals and at bedtime. 1700 mL 0  . cetirizine (ZYRTEC) 10 MG tablet Take 10 mg by mouth daily as needed for allergies.    Marland Kitchen clopidogrel (PLAVIX) 75 MG tablet Take 1 tablet (75 mg total) by mouth daily. Take Plavix 75 mg daily + Aspirin 81 mg with Food Daily For 3 weeks , then STOP Plavix and Take Aspirin only 21 tablet 0  . cyclobenzaprine (FLEXERIL) 10 MG tablet Take 1 tablet (10 mg total) by mouth 3 (three) times daily as needed for muscle spasms. 30 tablet 2  . folic acid (FOLVITE) 1 MG tablet Take 1 tablet (1 mg total) by mouth daily. 30 tablet 5  . ibuprofen (ADVIL) 800 MG tablet Take 1 tablet (800 mg total) by mouth every 8 (eight) hours as needed. 30 tablet 3  . meclizine (ANTIVERT) 25 MG tablet Take 1 tablet (25 mg total) by mouth 3 (three) times daily as needed for dizziness. 30 tablet 2  . Melatonin 1 MG TABS Take 1 mg by mouth daily as needed (sleep).     . Multiple Vitamin (MULTIVITAMIN WITH MINERALS) TABS tablet Take 1 tablet by mouth daily. 30 tablet 5  . olopatadine (PATANOL) 0.1 % ophthalmic solution Place 1 drop into the right eye 2 (two) times daily. 5 mL 12  . sildenafil (VIAGRA) 100 MG tablet Take 1 tablet (100 mg total) by mouth daily as needed for erectile dysfunction. 10 tablet 3  . thiamine 100 MG tablet Take 1 tablet (100 mg total) by mouth daily. 30 tablet 5  . topiramate (TOPAMAX) 50 MG tablet Take 1 tablet (50 mg total) by mouth 2 (two) times daily. 60 tablet 2  . fluticasone (FLONASE) 50 MCG/ACT nasal spray Place 2 sprays into both nostrils daily. (Patient not taking: Reported on 06/02/2018) 1 g 0  . sucralfate (CARAFATE) 1 g tablet Take 1 tablet (1 g total) by mouth 4 (four) times daily -  with meals and at bedtime. (Patient not taking: Reported on 01/19/2019) 120 tablet 2  . traZODone (DESYREL) 50 MG tablet Take 0.5-1 tablets (25-50 mg total) by mouth at bedtime as needed for sleep. 30 tablet 3  . pantoprazole (PROTONIX) 40 MG tablet Take 1 tablet (40 mg total) by mouth  daily. 30 tablet 3  . trimethoprim-polymyxin b (POLYTRIM) ophthalmic solution Place 1 drop into the right eye every 4 (four) hours. 10 mL 0   No facility-administered medications prior to visit.     Allergies  Allergen Reactions  . Other Swelling  Tomatoes Onions  *swelling in the mouth*  . Tramadol Swelling    lips    ROS Review of Systems  Constitutional: Negative.   HENT: Negative.   Eyes: Negative.   Respiratory: Negative.   Cardiovascular: Negative.   Gastrointestinal: Negative.   Endocrine: Negative.   Genitourinary: Negative.   Musculoskeletal: Positive for arthralgias (generalized pain).  Skin: Negative.   Neurological: Positive for dizziness (occasional ), weakness (left sided weakness) and headaches (occasional).  Hematological: Negative.   Psychiatric/Behavioral: Negative.       Objective:    Physical Exam  Constitutional: He is oriented to person, place, and time. He appears well-developed and well-nourished.  HENT:  Head: Normocephalic and atraumatic.  Eyes: Conjunctivae are normal.  Neck: Normal range of motion. Neck supple.  Cardiovascular: Normal rate, regular rhythm, normal heart sounds and intact distal pulses.  Pulmonary/Chest: Effort normal and breath sounds normal.  Abdominal: Soft. Bowel sounds are normal.  Musculoskeletal: Normal range of motion.  Neurological: He is alert and oriented to person, place, and time.  Skin: Skin is warm and dry.  Psychiatric:  Mild anxiety r/t health status.   Nursing note and vitals reviewed.   BP 115/70   Pulse 98   Temp 98.6 F (37 C) (Oral)   Ht 5\' 9"  (1.753 m)   Wt 270 lb 3.2 oz (122.6 kg)   SpO2 96%   BMI 39.90 kg/m  Wt Readings from Last 3 Encounters:  01/19/19 270 lb 3.2 oz (122.6 kg)  10/30/18 176 lb 9.6 oz (80.1 kg)  06/02/18 180 lb (81.6 kg)     Health Maintenance Due  Topic Date Due  . TETANUS/TDAP  09/11/1996  . INFLUENZA VACCINE  09/12/2018    There are no preventive care  reminders to display for this patient.  Lab Results  Component Value Date   TSH 1.570 04/07/2018   Lab Results  Component Value Date   WBC 6.7 06/02/2018   HGB 15.6 06/02/2018   HCT 45.9 06/02/2018   MCV 85.6 06/02/2018   PLT 252 06/02/2018   Lab Results  Component Value Date   NA 140 06/02/2018   K 4.0 06/02/2018   CO2 25 06/02/2018   GLUCOSE 116 (H) 06/02/2018   BUN 10 06/02/2018   CREATININE 0.93 06/02/2018   BILITOT 0.8 06/02/2018   ALKPHOS 76 06/02/2018   AST 37 06/02/2018   ALT 49 (H) 06/02/2018   PROT 6.9 06/02/2018   ALBUMIN 4.1 06/02/2018   CALCIUM 9.6 06/02/2018   ANIONGAP 10 06/02/2018   Lab Results  Component Value Date   CHOL 84 (L) 04/07/2018   Lab Results  Component Value Date   HDL 34 (L) 04/07/2018   Lab Results  Component Value Date   LDLCALC 41 04/07/2018   Lab Results  Component Value Date   TRIG 46 04/07/2018   Lab Results  Component Value Date   CHOLHDL 2.5 04/07/2018   Lab Results  Component Value Date   HGBA1C 5.4 10/30/2018   HGBA1C 5.4 10/30/2018   HGBA1C 5.4 (A) 10/30/2018   HGBA1C 5.4 10/30/2018      Assessment & Plan:   1. History of stroke  2. Elevated glucose Glucose level stable at 114 today. - Glucose (CBG) - Urinalysis Dipstick  3. Gastroesophageal reflux disease without esophagitis - famotidine (PEPCID) 20 MG tablet; Take 1 tablet (20 mg total) by mouth 2 (two) times daily.  Dispense: 60 tablet; Refill: 6  4. Tooth abscess - amoxicillin-clavulanate (AUGMENTIN) 875-125 MG tablet;  Take 1 tablet by mouth 2 (two) times daily for 10 days.  Dispense: 20 tablet; Refill: 0  5. Tooth pain - amoxicillin-clavulanate (AUGMENTIN) 875-125 MG tablet; Take 1 tablet by mouth 2 (two) times daily for 10 days.  Dispense: 20 tablet; Refill: 0  6. Possible exposure to STD - GC/Chlamydia Probe Amp([facility]) - Trichomonas vaginalis, RNA - HIV antibody (with reflex)  7. Left-sided weakness Stable. Not worsening.   8.  Blurry vision, bilateral L>R. He will follow up with Neurology.   9. Dizziness  10. Migraine without status migrainosus, not intractable, unspecified migraine type  11. Follow up He will follow up in 3 months.   Meds ordered this encounter  Medications  . famotidine (PEPCID) 20 MG tablet    Sig: Take 1 tablet (20 mg total) by mouth 2 (two) times daily.    Dispense:  60 tablet    Refill:  6  . amoxicillin-clavulanate (AUGMENTIN) 875-125 MG tablet    Sig: Take 1 tablet by mouth 2 (two) times daily for 10 days.    Dispense:  20 tablet    Refill:  0    Orders Placed This Encounter  Procedures  . GC/Chlamydia Probe Amp([facility])  . Trichomonas vaginalis, RNA  . HIV antibody (with reflex)  . Glucose (CBG)  . Urinalysis Dipstick    Referral Orders  No referral(s) requested today    Raliegh Ip,  MSN, FNP-BC [facility] [address] [place], [place] [address] [phone]- fax    Problem List Items Addressed This Visit      Cardiovascular and Mediastinum   Migraine without status migrainosus, not intractable     Digestive   Gastroesophageal reflux disease without esophagitis   Relevant Medications   famotidine (PEPCID) 20 MG tablet     Nervous and Auditory   Left-sided weakness     Other   Blurry vision, bilateral   Dizziness    Other Visit Diagnoses    Elevated glucose    -  Primary   Relevant Orders   Glucose (CBG) (Completed)   Urinalysis Dipstick (Completed)   History of stroke       Tooth abscess       Relevant Medications   amoxicillin-clavulanate (AUGMENTIN) 875-125 MG tablet   Tooth pain       Relevant Medications   amoxicillin-clavulanate (AUGMENTIN) 875-125 MG tablet   Possible exposure to STD       Relevant Orders   GC/Chlamydia Probe Amp([facility])   Trichomonas vaginalis, RNA   HIV antibody (with reflex) (Completed)   Follow up          Meds ordered  this encounter  Medications  . famotidine (PEPCID) 20 MG tablet    Sig: Take 1 tablet (20 mg total) by mouth 2 (two) times daily.    Dispense:  60 tablet    Refill:  6  . amoxicillin-clavulanate (AUGMENTIN) 875-125 MG tablet    Sig: Take 1 tablet by mouth 2 (two) times daily for 10 days.    Dispense:  20 tablet    Refill:  0    Follow-up: Return in about 3 months (around 04/19/2019).    Kallie Locks, FNP

## 2019-01-19 NOTE — Patient Instructions (Signed)
Gastroesophageal Reflux Disease, Adult Gastroesophageal reflux (GER) happens when acid from the stomach flows up into the tube that connects the mouth and the stomach (esophagus). Normally, food travels down the esophagus and stays in the stomach to be digested. However, when a person has GER, food and stomach acid sometimes move back up into the esophagus. If this becomes a more serious problem, the person may be diagnosed with a disease called gastroesophageal reflux disease (GERD). GERD occurs when the reflux:  Happens often.  Causes frequent or severe symptoms.  Causes problems such as damage to the esophagus. When stomach acid comes in contact with the esophagus, the acid may cause soreness (inflammation) in the esophagus. Over time, GERD may create small holes (ulcers) in the lining of the esophagus. What are the causes? This condition is caused by a problem with the muscle between the esophagus and the stomach (lower esophageal sphincter, or LES). Normally, the LES muscle closes after food passes through the esophagus to the stomach. When the LES is weakened or abnormal, it does not close properly, and that allows food and stomach acid to go back up into the esophagus. The LES can be weakened by certain dietary substances, medicines, and medical conditions, including:  Tobacco use.  Pregnancy.  Having a hiatal hernia.  Alcohol use.  Certain foods and beverages, such as coffee, chocolate, onions, and peppermint. What increases the risk? You are more likely to develop this condition if you:  Have an increased body weight.  Have a connective tissue disorder.  Use NSAID medicines. What are the signs or symptoms? Symptoms of this condition include:  Heartburn.  Difficult or painful swallowing.  The feeling of having a lump in the throat.  Abitter taste in the mouth.  Bad breath.  Having a large amount of saliva.  Having an upset or bloated stomach.  Belching.   Chest pain. Different conditions can cause chest pain. Make sure you see your health care provider if you experience chest pain.  Shortness of breath or wheezing.  Ongoing (chronic) cough or a night-time cough.  Wearing away of tooth enamel.  Weight loss. How is this diagnosed? Your health care provider will take a medical history and perform a physical exam. To determine if you have mild or severe GERD, your health care provider may also monitor how you respond to treatment. You may also have tests, including:  A test to examine your stomach and esophagus with a small camera (endoscopy).  A test thatmeasures the acidity level in your esophagus.  A test thatmeasures how much pressure is on your esophagus.  A barium swallow or modified barium swallow test to show the shape, size, and functioning of your esophagus. How is this treated? The goal of treatment is to help relieve your symptoms and to prevent complications. Treatment for this condition may vary depending on how severe your symptoms are. Your health care provider may recommend:  Changes to your diet.  Medicine.  Surgery. Follow these instructions at home: Eating and drinking   Follow a diet as recommended by your health care provider. This may involve avoiding foods and drinks such as: ? Coffee and tea (with or without caffeine). ? Drinks that containalcohol. ? Energy drinks and sports drinks. ? Carbonated drinks or sodas. ? Chocolate and cocoa. ? Peppermint and mint flavorings. ? Garlic and onions. ? Horseradish. ? Spicy and acidic foods, including peppers, chili powder, curry powder, vinegar, hot sauces, and barbecue sauce. ? Citrus fruit juices and citrus   fruits, such as oranges, lemons, and limes. ? Tomato-based foods, such as red sauce, chili, salsa, and pizza with red sauce. ? Fried and fatty foods, such as donuts, french fries, potato chips, and high-fat dressings. ? High-fat meats, such as hot dogs and  fatty cuts of red and white meats, such as rib eye steak, sausage, ham, and bacon. ? High-fat dairy items, such as whole milk, butter, and cream cheese.  Eat small, frequent meals instead of large meals.  Avoid drinking large amounts of liquid with your meals.  Avoid eating meals during the 2-3 hours before bedtime.  Avoid lying down right after you eat.  Do not exercise right after you eat. Lifestyle   Do not use any products that contain nicotine or tobacco, such as cigarettes, e-cigarettes, and chewing tobacco. If you need help quitting, ask your health care provider.  Try to reduce your stress by using methods such as yoga or meditation. If you need help reducing stress, ask your health care provider.  If you are overweight, reduce your weight to an amount that is healthy for you. Ask your health care provider for guidance about a safe weight loss goal. General instructions  Pay attention to any changes in your symptoms.  Take over-the-counter and prescription medicines only as told by your health care provider. Do not take aspirin, ibuprofen, or other NSAIDs unless your health care provider told you to do so.  Wear loose-fitting clothing. Do not wear anything tight around your waist that causes pressure on your abdomen.  Raise (elevate) the head of your bed about 6 inches (15 cm).  Avoid bending over if this makes your symptoms worse.  Keep all follow-up visits as told by your health care provider. This is important. Contact a health care provider if:  You have: ? New symptoms. ? Unexplained weight loss. ? Difficulty swallowing or it hurts to swallow. ? Wheezing or a persistent cough. ? A hoarse voice.  Your symptoms do not improve with treatment. Get help right away if you:  Have pain in your arms, neck, jaw, teeth, or back.  Feel sweaty, dizzy, or light-headed.  Have chest pain or shortness of breath.  Vomit and your vomit looks like blood or coffee grounds.   Faint.  Have stool that is bloody or black.  Cannot swallow, drink, or eat. Summary  Gastroesophageal reflux happens when acid from the stomach flows up into the esophagus. GERD is a disease in which the reflux happens often, causes frequent or severe symptoms, or causes problems such as damage to the esophagus.  Treatment for this condition may vary depending on how severe your symptoms are. Your health care provider may recommend diet and lifestyle changes, medicine, or surgery.  Contact a health care provider if you have new or worsening symptoms.  Take over-the-counter and prescription medicines only as told by your health care provider. Do not take aspirin, ibuprofen, or other NSAIDs unless your health care provider told you to do so.  Keep all follow-up visits as told by your health care provider. This is important. This information is not intended to replace advice given to you by your health care provider. Make sure you discuss any questions you have with your health care provider. Document Released: 11/07/2004 Document Revised: 08/06/2017 Document Reviewed: 08/06/2017 Elsevier Patient Education  2020 Elsevier Inc. Famotidine tablets or gelcaps What is this medicine? FAMOTIDINE (fa MOE ti deen) is a type of antihistamine that blocks the release of stomach acid. It is  used to treat stomach or intestinal ulcers. It can also relieve heartburn from acid reflux. This medicine may be used for other purposes; ask your health care provider or pharmacist if you have questions. COMMON BRAND NAME(S): Heartburn Relief, Pepcid, Pepcid AC, Pepcid AC Maximum Strength What should I tell my health care provider before I take this medicine? They need to know if you have any of these conditions:  kidney or liver disease  trouble swallowing  an unusual or allergic reaction to famotidine, other medicines, foods, dyes, or preservatives  pregnant or trying to get pregnant  breast-feeding How  should I use this medicine? Take this medicine by mouth with a glass of water. Follow the directions on the prescription label. If you only take this medicine once a day, take it at bedtime. Take your doses at regular intervals. Do not take your medicine more often than directed. Talk to your pediatrician regarding the use of this medicine in children. Special care may be needed. Overdosage: If you think you have taken too much of this medicine contact a poison control center or emergency room at once. NOTE: This medicine is only for you. Do not share this medicine with others. What if I miss a dose? If you miss a dose, take it as soon as you can. If it is almost time for your next dose, take only that dose. Do not take double or extra doses. What may interact with this medicine?  delavirdine  itraconazole  ketoconazole This list may not describe all possible interactions. Give your health care provider a list of all the medicines, herbs, non-prescription drugs, or dietary supplements you use. Also tell them if you smoke, drink alcohol, or use illegal drugs. Some items may interact with your medicine. What should I watch for while using this medicine? Tell your doctor or health care professional if your condition does not start to get better or if it gets worse. Finish the full course of tablets prescribed, even if you feel better. Do not take with aspirin, ibuprofen or other antiinflammatory medicines. These can make your condition worse. Do not smoke cigarettes or drink alcohol. These cause irritation in your stomach and can increase the time it will take for ulcers to heal. If you get black, tarry stools or vomit up what looks like coffee grounds, call your doctor or health care professional at once. You may have a bleeding ulcer. This medicine may cause a decrease in vitamin B12. You should make sure that you get enough vitamin B12 while you are taking this medicine. Discuss the foods you eat  and the vitamins you take with your health care professional. What side effects may I notice from receiving this medicine? Side effects that you should report to your doctor or health care professional as soon as possible:  agitation, nervousness  confusion  hallucinations  skin rash, itching Side effects that usually do not require medical attention (report to your doctor or health care professional if they continue or are bothersome):  constipation  diarrhea  dizziness  headache This list may not describe all possible side effects. Call your doctor for medical advice about side effects. You may report side effects to FDA at 1-800-FDA-1088. Where should I keep my medicine? Keep out of the reach of children. Store at room temperature between 15 and 30 degrees C (59 and 86 degrees F). Do not freeze. Throw away any unused medicine after the expiration date. NOTE: This sheet is a summary. It may not  cover all possible information. If you have questions about this medicine, talk to your doctor, pharmacist, or health care provider.  2020 Elsevier/Gold Standard (2016-09-13 13:17:50)

## 2019-01-20 LAB — HIV ANTIBODY (ROUTINE TESTING W REFLEX): HIV Screen 4th Generation wRfx: NONREACTIVE

## 2019-01-21 LAB — GC/CHLAMYDIA PROBE AMP
Chlamydia trachomatis, NAA: NEGATIVE
Neisseria Gonorrhoeae by PCR: NEGATIVE

## 2019-01-21 LAB — TRICHOMONAS VAGINALIS, PROBE AMP: Trich vag by NAA: NEGATIVE

## 2019-04-16 ENCOUNTER — Telehealth: Payer: Self-pay | Admitting: Family Medicine

## 2019-04-16 NOTE — Telephone Encounter (Signed)
Pt was called and reminded of there appointment 

## 2019-04-19 ENCOUNTER — Ambulatory Visit: Payer: Self-pay | Admitting: Family Medicine

## 2019-06-24 ENCOUNTER — Ambulatory Visit (INDEPENDENT_AMBULATORY_CARE_PROVIDER_SITE_OTHER): Payer: BLUE CROSS/BLUE SHIELD | Admitting: Family Medicine

## 2019-06-24 ENCOUNTER — Encounter: Payer: Self-pay | Admitting: Gastroenterology

## 2019-06-24 ENCOUNTER — Other Ambulatory Visit: Payer: Self-pay

## 2019-06-24 ENCOUNTER — Encounter: Payer: Self-pay | Admitting: Family Medicine

## 2019-06-24 VITALS — BP 145/80 | HR 79 | Ht 70.0 in | Wt 179.2 lb

## 2019-06-24 DIAGNOSIS — Z1159 Encounter for screening for other viral diseases: Secondary | ICD-10-CM

## 2019-06-24 DIAGNOSIS — R1013 Epigastric pain: Secondary | ICD-10-CM | POA: Insufficient documentation

## 2019-06-24 DIAGNOSIS — K5900 Constipation, unspecified: Secondary | ICD-10-CM

## 2019-06-24 DIAGNOSIS — Z8673 Personal history of transient ischemic attack (TIA), and cerebral infarction without residual deficits: Secondary | ICD-10-CM | POA: Insufficient documentation

## 2019-06-24 DIAGNOSIS — R030 Elevated blood-pressure reading, without diagnosis of hypertension: Secondary | ICD-10-CM | POA: Insufficient documentation

## 2019-06-24 DIAGNOSIS — K029 Dental caries, unspecified: Secondary | ICD-10-CM | POA: Insufficient documentation

## 2019-06-24 DIAGNOSIS — R109 Unspecified abdominal pain: Secondary | ICD-10-CM | POA: Insufficient documentation

## 2019-06-24 MED ORDER — OMEPRAZOLE 40 MG PO CPDR: 40.0000 mg | DELAYED_RELEASE_CAPSULE | Freq: Every day | ORAL | 0 refills | Status: AC

## 2019-06-24 MED ORDER — SUCRALFATE 1 G PO TABS: 1.0000 g | ORAL_TABLET | Freq: Three times a day (TID) | ORAL | 2 refills | Status: AC

## 2019-06-24 MED ORDER — ASPIRIN EC 81 MG PO TBEC: 81.0000 mg | DELAYED_RELEASE_TABLET | Freq: Every day | ORAL | 11 refills | Status: AC

## 2019-06-24 NOTE — Patient Instructions (Addendum)
Please STOP your plavix (clopidogrel)  Continue your aspirin every single day   Please stop Aleve and Motrin---no 'NSAIDs' You can take Tylenol for your pain     It was wonderful to see you today.  Please bring ALL of your medications with you to every visit.   Thank you for choosing [facility].   Please call [phone] with any questions about today's appointment.  Please be sure to schedule follow up at the front  desk before you leave today.   Jasmine Awe DO  Family Medicine

## 2019-06-24 NOTE — Progress Notes (Signed)
    SUBJECTIVE:   CHIEF COMPLAINT / HPI:   [name] is a 44 yo male that presents to establish care. His concerns are addressed below.   Abdominal pain Right sided abdominal pain for 1 month but report chronic stomach pain that comes and goes for months on end.  He feels a burning pain every night with associated tightness and bloating. He is having normal bowel movements although it seems to be pebble like. He denies having to strain with bowel movements and no reported blood in his stool. He does have occasional light headedness. ETOH use about 2 drinks of liquor daily.  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 Did not follow last year d/t lack of insurance. Continues to take Plavix and ASA. Was not aware to stop the Plavix after 3 weeks. Endorses 1/2 per day tobacco use.  Dental clearance Have several dental abscess and will need to get several teeth pulled. Dentist want to know he is healthy enough for the procedure.   PERTINENT  PMH / PSH: Migraine headaches, GERD, ED, Alcohol and tobacco use  OBJECTIVE:   BP (!) 145/80   Pulse 79   Ht 5\' 10"  (1.778 m)   Wt 179 lb 3.2 oz (81.3 kg)   SpO2 99%   BMI 25.71 kg/m    General: Appears well, no acute distress. Age appropriate. HEENT: Yellow conjunctivae. Patent nares. Bilateral posterior mandibular tooth decay Cardiac: RRR, normal heart sounds, no murmurs Respiratory: Bilateral expiratory wheezin in LL, normal effort Abdomen: soft, non distended, rt sided rebound tenderness, +bowel sounds Extremities: No edema or cyanosis.  Neuro: PERRL. Alert and oriented, no focal deficits  ASSESSMENT/PLAN:   Abdominal pain Acute on chronic. Intermittent and worse at night ellicit a burning sensation. Has hx of chronic NSAID use. No signs of active bleeding and pain not out of proportion. Normal bowel sounds and movement. Not taking Carafate on medication list. Has a hx of GERD. There is concern for  cirrhotic changes. Consider pancreatitis, cholecystitis, PUD, GERD. High suspicion for dyspepsia. Will refill carafate and add PPI. Given patient hx will refer to GI for further evaluation. -Continue carafate -Start omeprazole 20 mg daily -CBC, CMP -Referral to GI  History of CVA (cerebrovascular accident) No concerns today. No focal deficits. Needs neurology follow up.  -Discontinue plavix -Continue ASA -Smoking cessation discussed -Lipid panel -Referral to Neurology -Follow up in 1 week for BP recheck and to discuss smoking cessation  Active dental caries Visual molar tooth decay. Preparing for dental procedure. Gupta score 0.1% indicating low risk of MI or cardiac arrest intraoperatively or up to 30 days post-op.   Elevated blood pressure reading BP 145/80. No formal diagnosis.  -Recheck BP at follow up visit  -Follow up in 1 week to recheck BP; patient instructed to take a BP reading outside of the office   , DO [facility]

## 2019-06-24 NOTE — Assessment & Plan Note (Signed)
BP 145/80. No formal diagnosis.  -Recheck BP at follow up visit  -Follow up in 1 week to recheck BP; patient instructed to take a BP reading outside of the office

## 2019-06-24 NOTE — Assessment & Plan Note (Signed)
Acute on chronic. Intermittent and worse at night ellicit a burning sensation. Has hx of chronic NSAID use. No signs of active bleeding and pain not out of proportion. Normal bowel sounds and movement. Not taking Carafate on medication list. Has a hx of GERD. There is concern for cirrhotic changes. Consider pancreatitis, cholecystitis, PUD, GERD. High suspicion for dyspepsia. Will refill carafate and add PPI. Given patient hx will refer to GI for further evaluation. -Continue carafate -Start omeprazole 20 mg daily -CBC, CMP -Referral to GI

## 2019-06-24 NOTE — Assessment & Plan Note (Addendum)
No concerns today. No focal deficits. Needs neurology follow up.  -Discontinue plavix -Continue ASA -Smoking cessation discussed -Lipid panel -Referral to Neurology -Follow up in 1 week for BP recheck and to discuss smoking cessation

## 2019-06-24 NOTE — Assessment & Plan Note (Signed)
Visual molar tooth decay. Preparing for dental procedure. Gupta score 0.1% indicating low risk of MI or cardiac arrest intraoperatively or up to 30 days post-op.

## 2019-06-25 LAB — COMPREHENSIVE METABOLIC PANEL
ALT: 25 IU/L (ref 0–44)
AST: 29 IU/L (ref 0–40)
Albumin/Globulin Ratio: 1.9 (ref 1.2–2.2)
Albumin: 4.7 g/dL (ref 4.0–5.0)
Alkaline Phosphatase: 108 IU/L (ref 39–117)
BUN/Creatinine Ratio: 13 (ref 9–20)
BUN: 12 mg/dL (ref 6–24)
Bilirubin Total: 0.2 mg/dL (ref 0.0–1.2)
CO2: 22 mmol/L (ref 20–29)
Calcium: 9.6 mg/dL (ref 8.7–10.2)
Chloride: 105 mmol/L (ref 96–106)
Creatinine, Ser: 0.96 mg/dL (ref 0.76–1.27)
GFR calc Af Amer: 113 mL/min/{1.73_m2} (ref >59)
GFR calc non Af Amer: 98 mL/min/{1.73_m2} (ref >59)
Globulin, Total: 2.5 g/dL (ref 1.5–4.5)
Glucose: 110 mg/dL — ABNORMAL HIGH (ref 65–99)
Potassium: 4.6 mmol/L (ref 3.5–5.2)
Sodium: 140 mmol/L (ref 134–144)
Total Protein: 7.2 g/dL (ref 6.0–8.5)

## 2019-06-25 LAB — HCV AB W REFLEX TO QUANT PCR: HCV Ab: <0.1 s/co ratio (ref 0.0–0.9)

## 2019-06-25 LAB — CBC
Hematocrit: 47.1 % (ref 37.5–51.0)
Hemoglobin: 16.2 g/dL (ref 13.0–17.7)
MCH: 29.8 pg (ref 26.6–33.0)
MCHC: 34.4 g/dL (ref 31.5–35.7)
MCV: 87 fL (ref 79–97)
Platelets: 286 10*3/uL (ref 150–450)
RBC: 5.43 x10E6/uL (ref 4.14–5.80)
RDW: 13.2 % (ref 11.6–15.4)
WBC: 6.3 10*3/uL (ref 3.4–10.8)

## 2019-06-25 LAB — HCV INTERPRETATION

## 2019-06-30 ENCOUNTER — Encounter: Payer: Self-pay | Admitting: Neurology

## 2019-07-09 MED FILL — SUCRALFATE 1 GM TABLET: 1 | 30 days supply | Qty: 120 | Fill #0

## 2019-07-09 MED FILL — OMEPRAZOLE DR 40 MG CAPSULE: 40 | 30 days supply | Qty: 30 | Fill #0

## 2019-07-13 ENCOUNTER — Telehealth: Payer: Self-pay | Admitting: *Deleted

## 2019-07-13 NOTE — Telephone Encounter (Signed)
-----   Message from [facility], DO sent at 07/13/2019  1:27 PM EDT ----- I have reviewed all lab results which are normal or stable. Will need to repeat Hgb A1c at follow up visit. Please inform the patient.

## 2019-07-13 NOTE — Telephone Encounter (Signed)
Pt informed. Kambria Grima, CMA  

## 2019-08-03 ENCOUNTER — Ambulatory Visit: Payer: BLUE CROSS/BLUE SHIELD | Admitting: Gastroenterology

## 2019-09-23 NOTE — Progress Notes (Signed)
NEUROLOGY CONSULTATION NOTE  SHAHAB POLHAMUS MRN: [id number] DOB: [birth year]  Referring provider: Terisa Starr, MD Primary care provider: Lavonda Jumbo, DO  Reason for consult:  History of CVA  HISTORY OF PRESENT ILLNESS: Hector Wilcox. Maxson is a 44 year old right-handed male with migraine, sickle cell trait who presents for history of stroke.  History supplemented by hospital and referring provider's notes.  CT and MRI of brain and CTA of head and neck from hospitalization personally reviewed.  He is accompanied by his roommate who supplements history.  He was admitted to [facility] in February 2020 after presenting with headache and blurred vision.  Neurologic exam demonstrated left upper quandrantanopsia.  CT head showed right PCA infarct, which was confirmed on MRI of brain.  CTA of head and neck showed no large vessel occlusion or stenosis.  LDL was 86 and Hgb A1c 5.9.  UDS negative.  2D echocardiogram was unremarkable.  TEE showed EF 50-55% with no cardiac source of embolus .  He was discharged on ASA and Plavix and Lipitor 40mg  daily.  He didn't follow up with outpatient neurology due to lack of insurance.  He established care with PCP in May.  At the time, he was still taking dual antiplatelet therapy, so Plavix was discontinued and he was advised to continue ASA.   Since then, he reports that his eyes have to adjust when he gets up in the morning.  He continued to have headaches.  It is a mild right sided pressure (sometimes pounding) that radiates either to the back of the head or across the front to the left temple that lasts about 30 minutes with Advil, Aleve or Tylenol.  There is associated photophobia, phonophobia and sometimes nausea.  They occur 3 to 4 times a week.  Beginning 6 months ago, he reports slurred speech and feels unsteady on feet.    Currently taking ASA 81mg , atorvastatin 40mg   PAST MEDICAL HISTORY: Past Medical History:  Diagnosis Date  . Acid reflux   .  Constipation   . Hypocalcemia   . Muscle spasm   . Sickle cell trait (HCC)   . 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 Tooth abscess 11/2018  . Tooth pain 11/2018    PAST SURGICAL HISTORY: Past Surgical History:  Procedure Laterality Date  . TEE WITHOUT CARDIOVERSION N/A 03/19/2018   Procedure: TRANSESOPHAGEAL ECHOCARDIOGRAM (TEE);  Surgeon: 12/2018, MD;  Location: [facility] ENDOSCOPY;  Service: Cardiovascular;  Laterality: N/A;    MEDICATIONS: Current Outpatient Medications on File Prior to Visit  Medication Sig Dispense Refill  . aspirin EC 81 MG tablet Take 1 tablet (81 mg total) by mouth daily. Take Plavix 75 mg daily + Aspirin 81 mg with Food Daily For 3 weeks , then STOP Plavix and Take Aspirin only 30 tablet 11  . atorvastatin (LIPITOR) 40 MG tablet Take 1 tablet (40 mg total) by mouth every evening. 30 tablet 5  . bismuth subsalicylate (PEPTO-BISMOL) 262 MG/15ML suspension Take 30 mLs by mouth 4 (four) times daily -  before meals and at bedtime. 1700 mL 0  . cetirizine (ZYRTEC) 10 MG tablet Take 10 mg by mouth daily as needed for allergies.    . famotidine (PEPCID) 20 MG tablet Take 1 tablet (20 mg total) by mouth 2 (two) times daily. 60 tablet 6  . folic acid (FOLVITE) 1 MG tablet Take 1 tablet (1 mg total) by mouth daily. 30 tablet 5  . Multiple Vitamin (MULTIVITAMIN WITH MINERALS) TABS  tablet Take 1 tablet by mouth daily. 30 tablet 5  . omeprazole (PRILOSEC) 40 MG capsule Take 1 capsule (40 mg total) by mouth daily. 30 capsule 0  . sucralfate (CARAFATE) 1 g tablet Take 1 tablet (1 g total) by mouth 4 (four) times daily -  with meals and at bedtime. 120 tablet 2   No current facility-administered medications on file prior to visit.    ALLERGIES: Allergies  Allergen Reactions  . Other Swelling    Tomatoes Onions  *swelling in the mouth*  . Tramadol Swelling    lips    FAMILY HISTORY: Family History  Problem Relation Age of Onset  . Heart failure Mother     SOCIAL  HISTORY: Social History   Socioeconomic History  . Marital status: Single    Spouse name: Not on file  . Number of children: Not on file  . Years of education: Not on file  . Highest education level: Not on file  Occupational History  . Not on file  Tobacco Use  . Smoking status: Former Smoker    Packs/day: 0.25    Years: 13.00    Pack years: 3.25    Types: Cigars  . Smokeless tobacco: Never Used  Vaping Use  . Vaping Use: Never used  Substance and Sexual Activity  . Alcohol use: Yes    Alcohol/week: 4.0 standard drinks    Types: 4 Cans of beer per week  . Drug use: No  . Sexual activity: Yes    Birth control/protection: Condom  Other Topics Concern  . Not on file  Social History Narrative  . Not on file   Social Determinants of Health   Financial Resource Strain:   . Difficulty of Paying Living Expenses:   Food Insecurity:   . Worried About Programme researcher, broadcasting/film/video in the Last Year:   . Barista in the Last Year:   Transportation Needs:   . Freight forwarder (Medical):   Marland Kitchen Lack of Transportation (Non-Medical):   Physical Activity:   . Days of Exercise per Week:   . Minutes of Exercise per Session:   Stress:   . Feeling of Stress :   Social Connections:   . Frequency of Communication with Friends and Family:   . Frequency of Social Gatherings with Friends and Family:   . Attends Religious Services:   . Active Member of Clubs or Organizations:   . Attends Banker Meetings:   Marland Kitchen Marital Status:   Intimate Partner Violence:   . Fear of Current or Ex-Partner:   . Emotionally Abused:   Marland Kitchen Physically Abused:   . Sexually Abused:     PHYSICAL EXAM: Blood pressure (!) 147/85, pulse 79, height 5\' 9"  (1.753 m), weight 176 lb (79.8 kg), SpO2 100 %. General: No acute distress.  Patient appears well-groomed.  Head:  Normocephalic/atraumatic Eyes:  fundi examined but not visualized Neck: supple, no paraspinal tenderness, full range of  motion Back: No paraspinal tenderness Heart: regular rate and rhythm Lungs: Clear to auscultation bilaterally. Vascular: No carotid bruits. Neurological Exam: Mental status: alert and oriented to person, place, and time, recent and remote memory intact, fund of knowledge intact, attention and concentration intact, speech fluent and not dysarthric, language intact. Cranial nerves: CN I: not tested CN II: pupils equal, round and reactive to light, left upper quandrantanopsia CN III, IV, VI:  full range of motion, no nystagmus, no ptosis CN V: facial sensation intact CN VII: upper  and lower face symmetric CN VIII: hearing intact CN IX, X: gag intact, uvula midline CN XI: sternocleidomastoid and trapezius muscles intact CN XII: tongue midline Bulk & Tone: normal, no fasciculations. Motor:  5/5 throughout  Sensation:  Pinprick and vibration sensation reduced in left upper and lower extremities. Deep Tendon Reflexes:  3+ left upper extremity, otherwise 2+ throughout, toes downgoing.  Finger to nose testing:  Without dysmetria.  Heel to shin:  Without dysmetria.  Gait:  Normal station and stride.  Able to turn and tandem walk. Romberg negative.  IMPRESSION: 1.  History of right PCA infarct, cryptogenic. 2.  Hypertension 3.  Tobacco use disorder  PLAN: 1.  For headache prevention, start nortriptyline 25mg  at bedtime.  We can increase to 50mg  at bedtime in 6 weeks if needed. 2.  Limit use of pain relievers (Tylenol) to no more than 2 days out of week to prevent risk of rebound or medication-overuse headache. 3.  Due to worsening headache, slurred speech and unsteady gait, will check MRI of brain without contrast. 4.  Secondary stroke prevention as per PCP: - ASA 81mg  daily - LDL goal less than 70 - Hgb A1c goal less than 7 - Blood pressure goal less than 130/90 - Mediterranean diet - Routine exercise - Smoking cessation 6.  Tobacco cessation counseling (CPT 99406):  Tobacco use  with history of CAD, stroke, or cancer  - Currently smoking 3 cigarettes/day   - Patient was informed of the dangers of tobacco abuse including stroke, cancer, and MI, as well as benefits of tobacco cessation. - Patient is willing to quit at this time. - Approximately 4 mins were spent counseling patient cessation techniques. We discussed various methods to help quit smoking, including deciding on a date to quit, joining a support group, pharmacological agents- nicotine gum/patch/lozenges, chantix.  - I will reassess his progress at the next follow-up visit 7.  Follow up as needed.  Thank you for allowing me to take part in the care of this patient.  , DO  CC:  , DO  , MD

## 2019-09-27 ENCOUNTER — Encounter: Payer: Self-pay | Admitting: Neurology

## 2019-09-27 ENCOUNTER — Ambulatory Visit (INDEPENDENT_AMBULATORY_CARE_PROVIDER_SITE_OTHER): Payer: BLUE CROSS/BLUE SHIELD | Admitting: Neurology

## 2019-09-27 ENCOUNTER — Other Ambulatory Visit: Payer: Self-pay

## 2019-09-27 VITALS — BP 147/85 | HR 79 | Ht 69.0 in | Wt 176.0 lb

## 2019-09-27 DIAGNOSIS — E785 Hyperlipidemia, unspecified: Secondary | ICD-10-CM

## 2019-09-27 DIAGNOSIS — Z8673 Personal history of transient ischemic attack (TIA), and cerebral infarction without residual deficits: Secondary | ICD-10-CM

## 2019-09-27 DIAGNOSIS — R2681 Unsteadiness on feet: Secondary | ICD-10-CM

## 2019-09-27 DIAGNOSIS — H53462 Homonymous bilateral field defects, left side: Secondary | ICD-10-CM

## 2019-09-27 DIAGNOSIS — R4781 Slurred speech: Secondary | ICD-10-CM

## 2019-09-27 DIAGNOSIS — G43909 Migraine, unspecified, not intractable, without status migrainosus: Secondary | ICD-10-CM

## 2019-09-27 DIAGNOSIS — Z720 Tobacco use: Secondary | ICD-10-CM

## 2019-09-27 MED ORDER — NORTRIPTYLINE HCL 25 MG PO CAPS: 25.0000 mg | ORAL_CAPSULE | Freq: Every day | ORAL | 5 refills | Status: AC

## 2019-09-27 NOTE — Patient Instructions (Signed)
1.  To reduce headache frequency, start nortriptyline 25mg  at bedtime.  If headaches not improved in 6 weeks, contact me and we can increase dose 2.  Limit use of pain relievers to no more than 2 days out of week to prevent risk of rebound or medication-overuse headache. 3.  Will check MRI of brain without contrast 4.  Mediterranean diet (see below) 5.  Continue aspirin and atorvastatin 6.  Routine exercise 7.  Try to continue quitting smoking 8.  Follow up in 6 months   Mediterranean Diet A Mediterranean diet refers to food and lifestyle choices that are based on the traditions of countries located on the . This way of eating has been shown to help prevent certain conditions and improve outcomes for people who have chronic diseases, like kidney disease and heart disease. What are tips for following this plan? Lifestyle  Cook and eat meals together with your family, when possible.  Drink enough fluid to keep your urine clear or pale yellow.  Be physically active every day. This includes: ? Aerobic exercise like running or swimming. ? Leisure activities like gardening, walking, or housework.  Get 7-8 hours of sleep each night.  If recommended by your health care provider, drink red wine in moderation. This means 1 glass a day for nonpregnant women and 2 glasses a day for men. A glass of wine equals 5 oz (150 mL). Reading food labels   Check the serving size of packaged foods. For foods such as rice and pasta, the serving size refers to the amount of cooked product, not dry.  Check the total fat in packaged foods. Avoid foods that have saturated fat or trans fats.  Check the ingredients list for added sugars, such as corn syrup. Shopping  At the grocery store, buy most of your food from the areas near the walls of the store. This includes: ? Fresh fruits and vegetables (produce). ? Grains, beans, nuts, and seeds. Some of these may be available in unpackaged forms  or large amounts (in bulk). ? Fresh seafood. ? Poultry and eggs. ? Low-fat dairy products.  Buy whole ingredients instead of prepackaged foods.  Buy fresh fruits and vegetables in-season from local farmers markets.  Buy frozen fruits and vegetables in resealable bags.  If you do not have access to quality fresh seafood, buy precooked frozen shrimp or canned fish, such as tuna, salmon, or sardines.  Buy small amounts of raw or cooked vegetables, salads, or olives from the deli or salad bar at your store.  Stock your pantry so you always have certain foods on hand, such as olive oil, canned tuna, canned tomatoes, rice, pasta, and beans. Cooking  Cook foods with extra-virgin olive oil instead of using butter or other vegetable oils.  Have meat as a side dish, and have vegetables or grains as your main dish. This means having meat in small portions or adding small amounts of meat to foods like pasta or stew.  Use beans or vegetables instead of meat in common dishes like chili or lasagna.  Experiment with different cooking methods. Try roasting or broiling vegetables instead of steaming or sauteing them.  Add frozen vegetables to soups, stews, pasta, or rice.  Add nuts or seeds for added healthy fat at each meal. You can add these to yogurt, salads, or vegetable dishes.  Marinate fish or vegetables using olive oil, lemon juice, garlic, and fresh herbs. Meal planning   Plan to eat 1 vegetarian meal one day  each week. Try to work up to 2 vegetarian meals, if possible.  Eat seafood 2 or more times a week.  Have healthy snacks readily available, such as: ? Vegetable sticks with hummus. ? [place] yogurt. ? Fruit and nut trail mix.  Eat balanced meals throughout the week. This includes: ? Fruit: 2-3 servings a day ? Vegetables: 4-5 servings a day ? Low-fat dairy: 2 servings a day ? Fish, poultry, or lean meat: 1 serving a day ? Beans and legumes: 2 or more servings a week ? Nuts  and seeds: 1-2 servings a day ? Whole grains: 6-8 servings a day ? Extra-virgin olive oil: 3-4 servings a day  Limit red meat and sweets to only a few servings a month What are my food choices?  Mediterranean diet ? Recommended  Grains: Whole-grain pasta. Brown rice. Bulgar wheat. Polenta. Couscous. Whole-wheat bread. Orpah Cobb.  Vegetables: Artichokes. Beets. Broccoli. Cabbage. Carrots. Eggplant. Green beans. Chard. Kale. Spinach. Onions. Leeks. Peas. Squash. Tomatoes. Peppers. Radishes.  Fruits: Apples. Apricots. Avocado. Berries. Bananas. Cherries. Dates. Figs. Grapes. Lemons. Melon. Oranges. Peaches. Plums. Pomegranate.  Meats and other protein foods: Beans. Almonds. Sunflower seeds. Pine nuts. Peanuts. Cod. Salmon. Scallops. Shrimp. Tuna. Tilapia. Clams. Oysters. Eggs.  Dairy: Low-fat milk. Cheese. Greek yogurt.  Beverages: Water. Red wine. Herbal tea.  Fats and oils: Extra virgin olive oil. Avocado oil. Grape seed oil.  Sweets and desserts: [place] yogurt with honey. Baked apples. Poached pears. Trail mix.  Seasoning and other foods: Basil. Cilantro. Coriander. Cumin. Mint. Parsley. Sage. Rosemary. Tarragon. Garlic. Oregano. Thyme. Pepper. Balsalmic vinegar. Tahini. Hummus. Tomato sauce. Olives. Mushrooms. ? Limit these  Grains: Prepackaged pasta or rice dishes. Prepackaged cereal with added sugar.  Vegetables: Deep fried potatoes (french fries).  Fruits: Fruit canned in syrup.  Meats and other protein foods: Beef. Pork. Lamb. Poultry with skin. Hot dogs. Tomasa Blase.  Dairy: Ice cream. Sour cream. Whole milk.  Beverages: Juice. Sugar-sweetened soft drinks. Beer. Liquor and spirits.  Fats and oils: Butter. Canola oil. Vegetable oil. Beef fat (tallow). Lard.  Sweets and desserts: Cookies. Cakes. Pies. Candy.  Seasoning and other foods: Mayonnaise. Premade sauces and marinades. The items listed may not be a complete list. Talk with your dietitian about what dietary  choices are right for you. Summary  The Mediterranean diet includes both food and lifestyle choices.  Eat a variety of fresh fruits and vegetables, beans, nuts, seeds, and whole grains.  Limit the amount of red meat and sweets that you eat.  Talk with your health care provider about whether it is safe for you to drink red wine in moderation. This means 1 glass a day for nonpregnant women and 2 glasses a day for men. A glass of wine equals 5 oz (150 mL). This information is not intended to replace advice given to you by your health care provider. Make sure you discuss any questions you have with your health care provider. Document Revised: 09/28/2015 Document Reviewed: 09/21/2015 Elsevier Patient Education  2020 [employer].

## 2019-10-16 ENCOUNTER — Ambulatory Visit
Admission: RE | Admit: 2019-10-16 | Discharge: 2019-10-16 | Disposition: A | Discharge status: Home / Self Care | Payer: BLUE CROSS/BLUE SHIELD | Source: Ambulatory Visit | Attending: Neurology | Admitting: Neurology

## 2019-10-16 DIAGNOSIS — R4781 Slurred speech: Secondary | ICD-10-CM

## 2019-10-16 DIAGNOSIS — R2681 Unsteadiness on feet: Secondary | ICD-10-CM

## 2019-10-16 DIAGNOSIS — Z8673 Personal history of transient ischemic attack (TIA), and cerebral infarction without residual deficits: Secondary | ICD-10-CM

## 2019-10-16 DIAGNOSIS — G43909 Migraine, unspecified, not intractable, without status migrainosus: Secondary | ICD-10-CM

## 2019-10-19 ENCOUNTER — Telehealth: Payer: Self-pay

## 2019-10-19 NOTE — Telephone Encounter (Signed)
Pt advised of Dr.Jaffe note below.  

## 2019-10-19 NOTE — Telephone Encounter (Signed)
He says he has had the headache since the stroke.  However, I cannot say that the stroke is the reason that he continues to have headaches.

## 2019-10-19 NOTE — Telephone Encounter (Signed)
-----   Message from Drema Dallas, DO sent at 10/19/2019  7:17 AM EDT ----- MRI of brain showed old previous stroke but otherwise unremarkable.

## 2019-10-19 NOTE — Telephone Encounter (Signed)
Pt advised of his MRI results.   Pt wanted to know could this be the reason for his problems?

## 2020-03-27 NOTE — Progress Notes (Signed)
NEUROLOGY FOLLOW UP OFFICE NOTE  [name] [id number]   Subjective:  Hector Wilcox. Hector Wilcox is a 45 year old right-handed male with migraine, sickle cell trait who follows up for stroke.  UPDATE: Current medications:  ASA 81mg  daily, atorvastatin 40mg , nortriptyline 25mg  at bedtime  Due to worsening headache, visual changes, slurred speech and unsteady gait, MRI of brain without contrast was performed on 10/16/2019 which was personally reviewed and showed chronic right PCA infarct but no acute findings.  Still has slurred speech and change in vision at times.  Sometimes left arm feels numb.  Headaches are infrequent.   HISTORY: He was admitted to [facility] in February 2020 after presenting with headache and blurred vision.  Neurologic exam demonstrated left upper quandrantanopsia.  CT head showed right PCA infarct, which was confirmed on MRI of brain.  CTA of head and neck showed no large vessel occlusion or stenosis.  LDL was 86 and Hgb A1c 5.9.  UDS negative.  2D echocardiogram was unremarkable.  TEE showed EF 50-55% with no cardiac source of embolus .  He was discharged on ASA and Plavix and Lipitor 40mg  daily.  He didn't follow up with outpatient neurology due to lack of insurance.  He established care with PCP in May.  At the time, he was still taking dual antiplatelet therapy, so Plavix was discontinued and he was advised to continue ASA.   Since then, he reports that his eyes have to adjust when he gets up in the morning.  He continued to have headaches.  It is a mild right sided pressure (sometimes pounding) that radiates either to the back of the head or across the front to the left temple that lasts about 30 minutes with Advil, Aleve or Tylenol.  There is associated photophobia, phonophobia and sometimes nausea.  They occur 3 to 4 times a week.  Beginning 6 months ago, he reports slurred speech and feels unsteady on feet.     PAST MEDICAL HISTORY: Past Medical History:   Diagnosis Date  . Acid reflux   . Constipation   . Hypocalcemia   . Muscle spasm   . Sickle cell trait (HCC)   . 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 Tooth abscess 11/2018  . Tooth pain 11/2018    MEDICATIONS: Current Outpatient Medications on File Prior to Visit  Medication Sig Dispense Refill  . aspirin EC 81 MG tablet Take 1 tablet (81 mg total) by mouth daily. Take Plavix 75 mg daily + Aspirin 81 mg with Food Daily For 3 weeks , then STOP Plavix and Take Aspirin only 30 tablet 11  . atorvastatin (LIPITOR) 40 MG tablet Take 1 tablet (40 mg total) by mouth every evening. 30 tablet 5  . bismuth subsalicylate (PEPTO-BISMOL) 262 MG/15ML suspension Take 30 mLs by mouth 4 (four) times daily -  before meals and at bedtime. 1700 mL 0  . cetirizine (ZYRTEC) 10 MG tablet Take 10 mg by mouth daily as needed for allergies.    . famotidine (PEPCID) 20 MG tablet Take 1 tablet (20 mg total) by mouth 2 (two) times daily. 60 tablet 6  . folic acid (FOLVITE) 1 MG tablet Take 1 tablet (1 mg total) by mouth daily. 30 tablet 5  . Multiple Vitamin (MULTIVITAMIN WITH MINERALS) TABS tablet Take 1 tablet by mouth daily. 30 tablet 5  . nortriptyline (PAMELOR) 25 MG capsule Take 1 capsule (25 mg total) by mouth at bedtime. 30 capsule 5  . omeprazole (PRILOSEC)  40 MG capsule Take 1 capsule (40 mg total) by mouth daily. 30 capsule 0  . sucralfate (CARAFATE) 1 g tablet Take 1 tablet (1 g total) by mouth 4 (four) times daily -  with meals and at bedtime. 120 tablet 2   No current facility-administered medications on file prior to visit.    ALLERGIES: Allergies  Allergen Reactions  . Other Swelling    Tomatoes Onions  *swelling in the mouth*  . Tramadol Swelling    lips    FAMILY HISTORY: Family History  Problem Relation Age of Onset  . Heart failure Mother     SOCIAL HISTORY: Social History   Socioeconomic History  . Marital status: Single    Spouse name: Not on file  . Number of children: Not  on file  . Years of education: Not on file  . Highest education level: Not on file  Occupational History  . Not on file  Tobacco Use  . Smoking status: Former Smoker    Packs/day: 0.25    Years: 13.00    Pack years: 3.25    Types: Cigars  . Smokeless tobacco: Never Used  Vaping Use  . Vaping Use: Never used  Substance and Sexual Activity  . Alcohol use: Yes    Alcohol/week: 4.0 standard drinks    Types: 4 Cans of beer per week    Comment: occassional  . Drug use: No  . Sexual activity: Yes    Birth control/protection: Condom  Other Topics Concern  . Not on file  Social History Narrative  . Not on file   Social Determinants of Health   Financial Resource Strain: Not on file  Food Insecurity: Not on file  Transportation Needs: Not on file  Physical Activity: Not on file  Stress: Not on file  Social Connections: Not on file  Intimate Partner Violence: Not on file     Objective:  Blood pressure 118/76, pulse 85, height 5\' 9"  (1.753 m), weight 172 lb (78 kg), SpO2 97 %. General: No acute distress.  Patient appears well-groomed.   Head:  Normocephalic/atraumatic Eyes:  Fundi examined but not visualized Neck: supple, no paraspinal tenderness, full range of motion Heart:  Regular rate and rhythm Lungs:  Clear to auscultation bilaterally Back: No paraspinal tenderness Neurological Exam: alert and oriented to person, place, and time.  Speech fluent and not dysarthric, language intact. CN II-XII intact. Bulk and tone normal, muscle strength 5/5 throughout.  Pinprick and vibration sensation reduced in left upper and lower extremities..  Deep tendon reflexes  3+ left upper extremity, otherwise 2+ throughout.  Finger to nose and heel to shin testing intact.  Gait normal, Romberg negative.   Assessment/Plan:   1.  History of right PCA infarct, cryptogenic - I don't appreciate any gross visual field defect - recommended following up with eye doctor for visual field testing. 2.   Headache/Migraine - improved on nortriptyline 3.  Hypertension 4.  Tobacco use disorder  1.  Nortriptyline 25mg  at bedtime 2.  Limit use of pain relievers to no more than 2 days out of week to prevent risk of rebound or medication-overuse headache. 3.  Secondary stroke prevention as per PCP: - ASA 81mg  daily - LDL goal less than 70 - Hgb A1c goal less than 7 - Blood pressure goal less than 130/90 - Mediterranean diet - Routine exercise - Smoking cessation 4.  Follow up one year  , DO  CC:  , PA-C

## 2020-03-29 ENCOUNTER — Other Ambulatory Visit: Payer: Self-pay

## 2020-03-29 ENCOUNTER — Ambulatory Visit (INDEPENDENT_AMBULATORY_CARE_PROVIDER_SITE_OTHER): Payer: Self-pay | Admitting: Neurology

## 2020-03-29 ENCOUNTER — Encounter: Payer: Self-pay | Admitting: Neurology

## 2020-03-29 VITALS — BP 118/76 | HR 85 | Ht 69.0 in | Wt 172.0 lb

## 2020-03-29 DIAGNOSIS — Z8673 Personal history of transient ischemic attack (TIA), and cerebral infarction without residual deficits: Secondary | ICD-10-CM

## 2020-03-29 DIAGNOSIS — Z720 Tobacco use: Secondary | ICD-10-CM

## 2020-03-29 DIAGNOSIS — G43909 Migraine, unspecified, not intractable, without status migrainosus: Secondary | ICD-10-CM

## 2020-03-29 NOTE — Patient Instructions (Signed)
1.  Continue nortriptyline 25mg  at bedtime 2.  Aspirin 81mg  daily 3.  Follow up one year

## 2020-05-06 IMAGING — CT CT ANGIO NECK
2 of 8 series · 7 of 33 positions shown · IV contrast (ISOVUE)
Comparison: Prior CT and MRI from earlier the same day.

CLINICAL DATA: Initial evaluation for acute headache, blurry
vision. Found to have acute right PCA territory stroke.

EXAM:
CT ANGIOGRAPHY HEAD AND NECK
TECHNIQUE: Multidetector CT imaging of the head and neck was performed using
the standard protocol during bolus administration of intravenous
contrast. Multiplanar CT image reconstructions and MIPs were
obtained to evaluate the vascular anatomy. Carotid stenosis
measurements (when applicable) are obtained utilizing NASCET
criteria, using the distal internal carotid diameter as the
denominator.
CONTRAST:  80mL 2DWKVK-4DJ IOPAMIDOL (2DWKVK-4DJ) INJECTION 76%

[Series 7: ax thin · axial · 0.38mm/px · z∈[+1218,+1454]mm · 5 of 356 slices shown]
[im 60/356  soft-tissue]
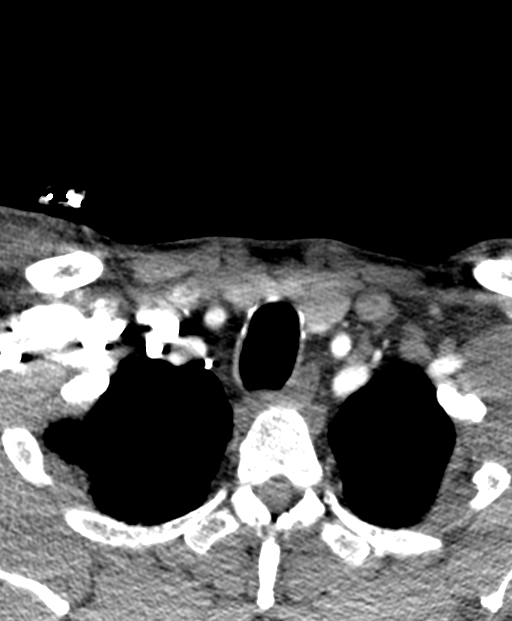
[im 119/356  bone]
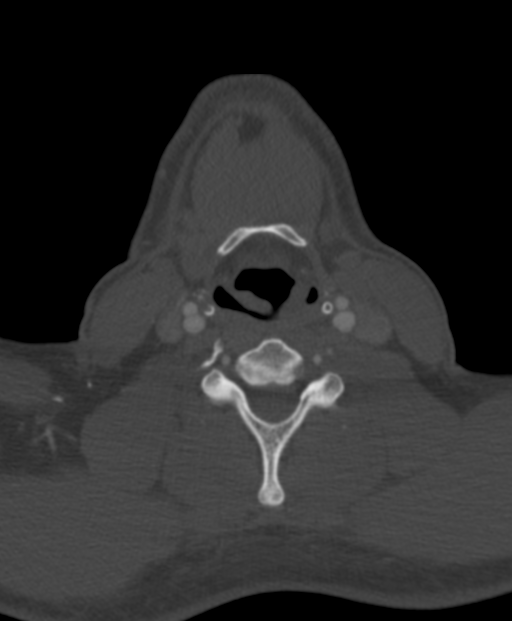
[im 178/356  soft-tissue]
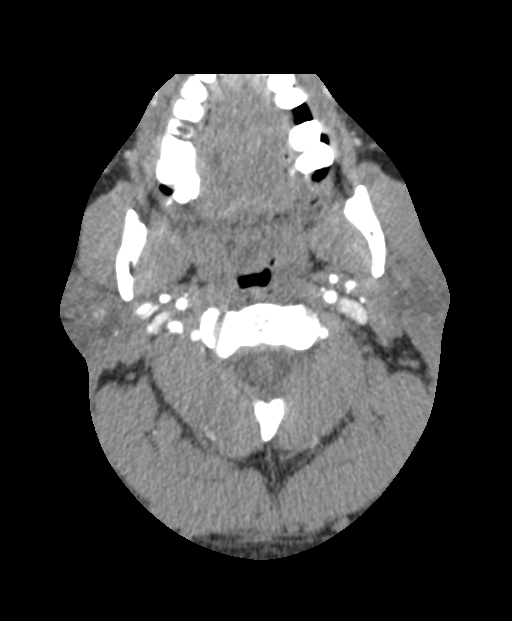
[im 237/356  bone]
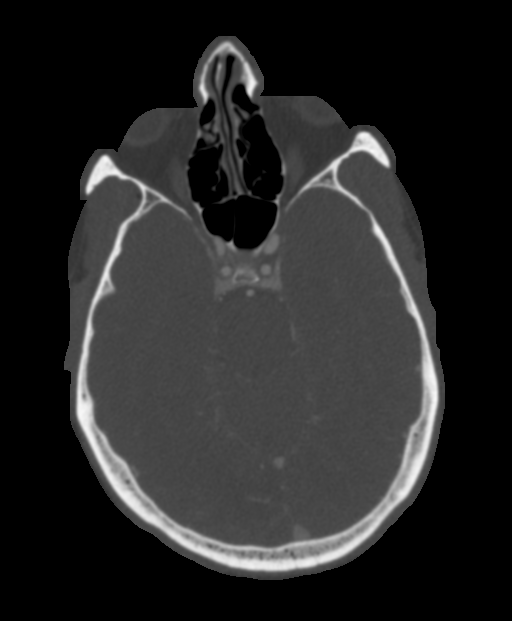
[im 296/356  soft-tissue]
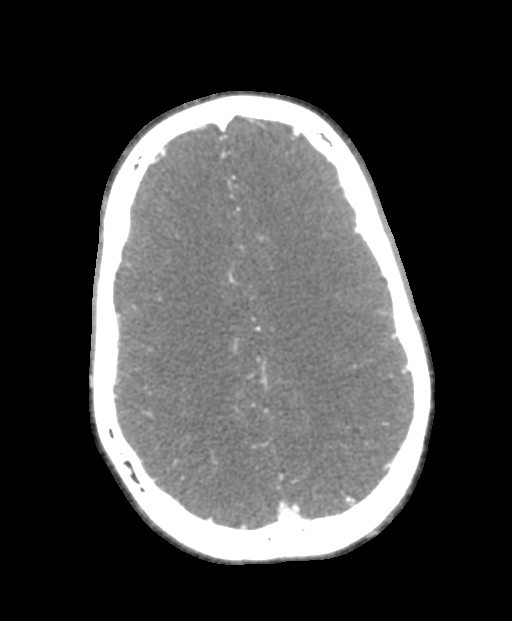

[Series 14: cta head neck · axial · 0.48mm/px · z∈[+1277,+1395]mm · 2 of 179 slices shown]
[im 60/179  soft-tissue]
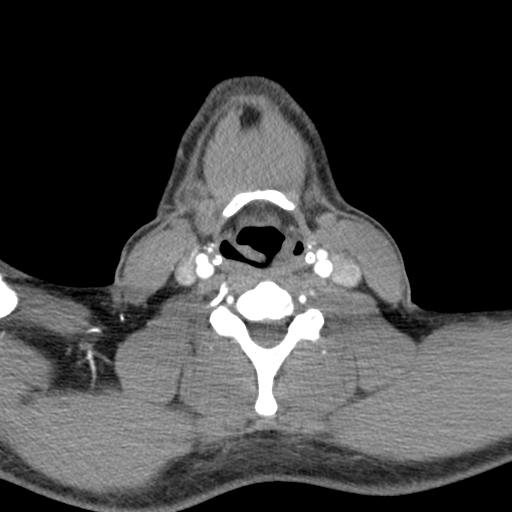
[im 119/179  soft-tissue]
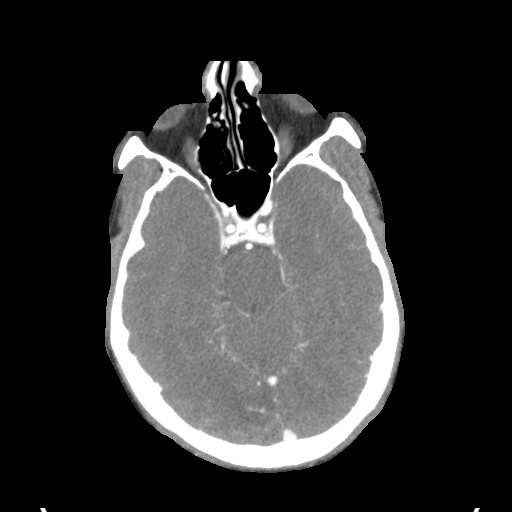

[7 of 33 positions shown; findings below may reference images not displayed]

FINDINGS: CTA NECK FINDINGS

Aortic arch: Visualized aortic arch of normal caliber with normal
branch pattern. Mild atheromatous plaque within the arch itself. No
flow-limiting stenosis about the origin of the great vessels.
Visualized subclavian arteries widely patent.

Right carotid system: Right common and internal carotid arteries
widely patent without stenosis, dissection, or occlusion.

Left carotid system: Left common and internal carotid arteries
widely patent without stenosis, dissection, or occlusion.

Vertebral arteries: Both of the vertebral arteries arise from the
subclavian arteries. Proximal right vertebral artery not well
visualized due to adjacent venous contamination. Vertebral arteries
otherwise widely patent within the neck without stenosis,
dissection, or occlusion.

Skeleton: No acute osseous abnormality. No discrete lytic or blastic
osseous lesions. Scattered dental caries noted.

Other neck: No other acute soft tissue abnormality within the neck.

Upper chest: Visualized upper chest demonstrates no acute finding.
Paraseptal and centrilobular emphysematous changes noted.

Review of the MIP images confirms the above findings

CTA HEAD FINDINGS

Anterior circulation: Internal carotid arteries widely patent to the
termini without stenosis. A1 segments, anterior communicating artery
common anterior cerebral arteries widely patent. M1 segments widely
patent bilaterally. Distal MCA branches well perfused and symmetric.

Posterior circulation: Vertebral arteries widely patent to the
vertebrobasilar junction without stenosis. Right PICA patent. Left
PICA not well seen. Basilar widely patent to its distal aspect
without stenosis. Superior cerebral arteries patent bilaterally.
Posterior cerebral arteries primarily supplied via the basilar,
although small bilateral posterior communicating arteries noted.
PCAs are patent to their distal aspects without proximal occlusion
or stenosis.

Venous sinuses: Patent.

Anatomic variants: None significant.

Delayed phase: Evolving right PCA territory infarct. No other
abnormal enhancement.

Review of the MIP images confirms the above findings
IMPRESSION: 1. Negative CTA of the head and neck. No large vessel occlusion. No
hemodynamically significant stenosis or other acute vascular
abnormality.
2. Emphysema.

## 2020-05-12 ENCOUNTER — Other Ambulatory Visit: Payer: Self-pay

## 2020-05-12 ENCOUNTER — Emergency Department (HOSPITAL_COMMUNITY)
Admission: EM | Admit: 2020-05-12 | Discharge: 2020-05-13 | Disposition: A | Discharge status: Home / Self Care | Payer: BLUE CROSS/BLUE SHIELD | Attending: Emergency Medicine | Admitting: Emergency Medicine

## 2020-05-12 ENCOUNTER — Encounter (HOSPITAL_COMMUNITY): Payer: Self-pay

## 2020-05-12 ENCOUNTER — Ambulatory Visit (HOSPITAL_COMMUNITY)
Admission: EM | Admit: 2020-05-12 | Discharge: 2020-05-12 | Disposition: A | Discharge status: Home / Self Care | Payer: BLUE CROSS/BLUE SHIELD

## 2020-05-12 ENCOUNTER — Encounter (HOSPITAL_COMMUNITY): Payer: Self-pay | Admitting: Emergency Medicine

## 2020-05-12 DIAGNOSIS — Z5321 Procedure and treatment not carried out due to patient leaving prior to being seen by health care provider: Secondary | ICD-10-CM | POA: Insufficient documentation

## 2020-05-12 DIAGNOSIS — H538 Other visual disturbances: Secondary | ICD-10-CM

## 2020-05-12 DIAGNOSIS — R202 Paresthesia of skin: Secondary | ICD-10-CM | POA: Diagnosis not present

## 2020-05-12 DIAGNOSIS — R030 Elevated blood-pressure reading, without diagnosis of hypertension: Secondary | ICD-10-CM | POA: Insufficient documentation

## 2020-05-12 DIAGNOSIS — F10129 Alcohol abuse with intoxication, unspecified: Secondary | ICD-10-CM | POA: Diagnosis not present

## 2020-05-12 DIAGNOSIS — R200 Anesthesia of skin: Secondary | ICD-10-CM | POA: Diagnosis not present

## 2020-05-12 DIAGNOSIS — R42 Dizziness and giddiness: Secondary | ICD-10-CM | POA: Diagnosis not present

## 2020-05-12 DIAGNOSIS — I10 Essential (primary) hypertension: Secondary | ICD-10-CM

## 2020-05-12 DIAGNOSIS — R519 Headache, unspecified: Secondary | ICD-10-CM

## 2020-05-12 DIAGNOSIS — K122 Cellulitis and abscess of mouth: Secondary | ICD-10-CM | POA: Insufficient documentation

## 2020-05-12 LAB — CBC WITH DIFFERENTIAL/PLATELET
Abs Immature Granulocytes: 0.01 10*3/uL (ref 0.00–0.07)
Basophils Absolute: 0 10*3/uL (ref 0.0–0.1)
Basophils Relative: 1 %
Eosinophils Absolute: 0.1 10*3/uL (ref 0.0–0.5)
Eosinophils Relative: 2 %
HCT: 48.7 % (ref 39.0–52.0)
Hemoglobin: 17.1 g/dL — ABNORMAL HIGH (ref 13.0–17.0)
Immature Granulocytes: 0 %
Lymphocytes Relative: 41 %
Lymphs Abs: 2.7 10*3/uL (ref 0.7–4.0)
MCH: 30.3 pg (ref 26.0–34.0)
MCHC: 35.1 g/dL (ref 30.0–36.0)
MCV: 86.2 fL (ref 80.0–100.0)
Monocytes Absolute: 0.6 10*3/uL (ref 0.1–1.0)
Monocytes Relative: 9 %
Neutro Abs: 3.2 10*3/uL (ref 1.7–7.7)
Neutrophils Relative %: 47 %
Platelets: 272 10*3/uL (ref 150–400)
RBC: 5.65 MIL/uL (ref 4.22–5.81)
RDW: 13.4 % (ref 11.5–15.5)
WBC: 6.6 10*3/uL (ref 4.0–10.5)
nRBC: 0 % (ref 0.0–0.2)

## 2020-05-12 LAB — URINALYSIS, ROUTINE W REFLEX MICROSCOPIC
Bilirubin Urine: NEGATIVE
Glucose, UA: NEGATIVE mg/dL
Hgb urine dipstick: NEGATIVE
Ketones, ur: NEGATIVE mg/dL
Leukocytes,Ua: NEGATIVE
Nitrite: NEGATIVE
Protein, ur: NEGATIVE mg/dL
Specific Gravity, Urine: 1.016 (ref 1.005–1.030)
pH: 7 (ref 5.0–8.0)

## 2020-05-12 LAB — COMPREHENSIVE METABOLIC PANEL
ALT: 26 U/L (ref 0–44)
AST: 28 U/L (ref 15–41)
Albumin: 4.5 g/dL (ref 3.5–5.0)
Alkaline Phosphatase: 90 U/L (ref 38–126)
Anion gap: 6 (ref 5–15)
BUN: 11 mg/dL (ref 6–20)
CO2: 27 mmol/L (ref 22–32)
Calcium: 9.5 mg/dL (ref 8.9–10.3)
Chloride: 105 mmol/L (ref 98–111)
Creatinine, Ser: 0.93 mg/dL (ref 0.61–1.24)
GFR, Estimated: >60 mL/min (ref >60)
Glucose, Bld: 93 mg/dL (ref 70–99)
Potassium: 4.2 mmol/L (ref 3.5–5.1)
Sodium: 138 mmol/L (ref 135–145)
Total Bilirubin: 0.9 mg/dL (ref 0.3–1.2)
Total Protein: 7.5 g/dL (ref 6.5–8.1)

## 2020-05-12 NOTE — ED Triage Notes (Signed)
Pt presents with hypertension, headache, dizziness, blurred vision xs 3 days. Denies any Chest pain.   States took BP before coming to UC and thinks the reading was 170/98

## 2020-05-12 NOTE — ED Provider Notes (Signed)
[facility]    CSN: [id number] Arrival date & time: 05/12/20  1824      History   Chief Complaint Chief Complaint  Patient presents with  . Hypertension  . Headache  . Dizziness  . Blurred Vision    HPI Hector Wilcox is a 45 y.o. male.   Hector Wilcox presents today with a several day history of headache and blurred vision. He reports associated bilateral hand numbness. Denies any focal weakness, dysarthria, chest pain. He denies any history of hypertension and is not currently on any antihypertensive medication. He has been monitoring his blood pressure at home and this was significantly elevated at 170/98 prompting evaluation. He reports severe headache with pain rated 8 on a 0-10 pain scale, localized to right temple without radiation, described as throbbing, worse with activity. He has a history of migraines but states headache is not similar to previous episodes. This is not the worst headache of his life and has improved some without intervention. He has not tried any over the counter medication for symptom management. He denies any head injury, medication change, decongestant use, increased caffeine intake. He does report mild congestion symptoms several weeks ago but denies any recent illness.      Past Medical History:  Diagnosis Date  . Acid reflux   . Constipation   . Hypocalcemia   . Muscle spasm   . Sickle cell trait (HCC)   . 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 Tooth abscess 11/2018  . Tooth pain 11/2018    Patient Active Problem List   Diagnosis Date Noted  . History of CVA (cerebrovascular accident) 06/24/2019  . Dyspepsia 06/24/2019  . Active dental caries 06/24/2019  . Abdominal pain 06/24/2019  . Elevated blood pressure reading 06/24/2019  . Blurry vision, bilateral 10/30/2018  . Migraine without status migrainosus, not intractable 10/30/2018  . Insomnia 10/30/2018  . Tobacco use 10/30/2018  . Alcohol use 10/30/2018  . Acute leg pain, left  06/02/2018  . Stroke (cerebrum) (HCC)   . Erectile dysfunction 04/29/2018  . Left-sided weakness 04/29/2018  . Dizziness 04/29/2018  . Fatigue 04/29/2018  . Gastroesophageal reflux disease without esophagitis 04/29/2018  . Screening for sickle-cell disease or trait 04/01/2018  . Acute right PCA stroke (HCC) 03/17/2018    Past Surgical History:  Procedure Laterality Date  . TEE WITHOUT CARDIOVERSION N/A 03/19/2018   Procedure: TRANSESOPHAGEAL ECHOCARDIOGRAM (TEE);  Surgeon: Lewayne Bunting, MD;  Location: [facility] ENDOSCOPY;  Service: Cardiovascular;  Laterality: N/A;       Home Medications    Prior to Admission medications   Medication Sig Start Date End Date Taking? Authorizing Provider  aspirin EC 81 MG tablet Take 1 tablet (81 mg total) by mouth daily. Take Plavix 75 mg daily + Aspirin 81 mg with Food Daily For 3 weeks , then STOP Plavix and Take Aspirin only 06/24/19   Autry-Lott, Randa Evens, DO  atorvastatin (LIPITOR) 40 MG tablet Take 1 tablet (40 mg total) by mouth every evening. 03/20/18   Shon Hale, MD  bismuth subsalicylate (PEPTO-BISMOL) 262 MG/15ML suspension Take 30 mLs by mouth 4 (four) times daily -  before meals and at bedtime. 01/13/15   Lyndal Pulley, MD  cetirizine (ZYRTEC) 10 MG tablet Take 10 mg by mouth daily as needed for allergies.    [provider]  famotidine (PEPCID) 20 MG tablet Take 1 tablet (20 mg total) by mouth 2 (two) times daily. 01/19/19   Kallie Locks, FNP  folic acid (  FOLVITE) 1 MG tablet Take 1 tablet (1 mg total) by mouth daily. 03/21/18   Shon Hale, MD  Multiple Vitamin (MULTIVITAMIN WITH MINERALS) TABS tablet Take 1 tablet by mouth daily. 03/21/18   Shon Hale, MD  nortriptyline (PAMELOR) 25 MG capsule Take 1 capsule (25 mg total) by mouth at bedtime. 09/27/19   Drema Dallas, DO  omeprazole (PRILOSEC) 40 MG capsule Take 1 capsule (40 mg total) by mouth daily. 06/24/19   Autry-Lott, Randa Evens, DO  sucralfate (CARAFATE) 1 g tablet  Take 1 tablet (1 g total) by mouth 4 (four) times daily -  with meals and at bedtime. 06/24/19   Autry-Lott, Randa Evens, DO    Family History Family History  Problem Relation Age of Onset  . Heart failure Mother     Social History Social History   Tobacco Use  . Smoking status: Former Smoker    Packs/day: 0.25    Years: 13.00    Pack years: 3.25    Types: Cigars  . Smokeless tobacco: Never Used  Vaping Use  . Vaping Use: Never used  Substance Use Topics  . Alcohol use: Yes    Alcohol/week: 4.0 standard drinks    Types: 4 Cans of beer per week    Comment: occassional  . Drug use: No     Allergies   Other and Tramadol   Review of Systems Review of Systems  Constitutional: Negative for activity change, appetite change, fatigue and fever.  Eyes: Positive for visual disturbance.  Respiratory: Positive for shortness of breath. Negative for cough.   Cardiovascular: Negative for chest pain and palpitations.  Gastrointestinal: Negative for abdominal pain, diarrhea, nausea and vomiting.  Neurological: Positive for dizziness, numbness (bilateral hands) and headaches. Negative for weakness and light-headedness.     Physical Exam Triage Vital Signs ED Triage Vitals  Enc Vitals Group     BP 05/12/20 1912 (!) 140/94     Pulse Rate 05/12/20 1912 67     Resp 05/12/20 1912 16     Temp 05/12/20 1912 98.7 F (37.1 C)     Temp Source 05/12/20 1912 Oral     SpO2 05/12/20 1912 98 %     Weight --      Height --      Head Circumference --      Peak Flow --      Pain Score 05/12/20 1909 5     Pain Loc --      Pain Edu? --      Excl. in GC? --    No data found.  Updated Vital Signs BP (!) 152/94 (BP Location: Right Arm)   Pulse 67   Temp 98.7 F (37.1 C) (Oral)   Resp 16   SpO2 97%   Visual Acuity Right Eye Distance:   Left Eye Distance:   Bilateral Distance:    Right Eye Near:   Left Eye Near:    Bilateral Near:     Physical Exam Vitals reviewed.   Constitutional:      General: He is awake. He is not in acute distress.    Appearance: Normal appearance. He is not ill-appearing.     Comments: Very pleasant male appears stated age in no acute distress.   HENT:     Head: Normocephalic and atraumatic.     Right Ear: Tympanic membrane, ear canal and external ear normal. Tympanic membrane is not erythematous or bulging.     Left Ear: Tympanic membrane, ear canal and external  ear normal. Tympanic membrane is not erythematous or bulging.     Nose: Nose normal.     Mouth/Throat:     Mouth: Mucous membranes are moist.     Pharynx: Uvula midline. No oropharyngeal exudate or posterior oropharyngeal erythema.  Eyes:     Extraocular Movements: Extraocular movements intact.     Pupils: Pupils are equal, round, and reactive to light.  Cardiovascular:     Rate and Rhythm: Normal rate and regular rhythm.     Heart sounds: No murmur heard.   Pulmonary:     Effort: Pulmonary effort is normal.     Breath sounds: Normal breath sounds. No wheezing, rhonchi or rales.     Comments: Clear to auscultation bilaterally  Musculoskeletal:     Cervical back: Normal range of motion and neck supple.     Right lower leg: No edema.     Left lower leg: No edema.     Comments: Strength 5/5 bilateral upper and lower extremities.   Neurological:     General: No focal deficit present.     Mental Status: He is alert and oriented to person, place, and time.     Cranial Nerves: Cranial nerves are intact. No cranial nerve deficit.     Motor: No weakness.     Gait: Gait is intact.     Comments: Cranial nerves 2-12 intact. No focal neurological defect noted on exam.    Psychiatric:        Behavior: Behavior is cooperative.      UC Treatments / Results  Labs (all labs ordered are listed, but only abnormal results are displayed) Labs Reviewed - No data to display  EKG   Radiology No results found.  Procedures Procedures (including critical care  time)  Medications Ordered in UC Medications - No data to display  Initial Impression / Assessment and Plan / UC Course  I have reviewed the triage vital signs and the nursing notes.  Pertinent labs & imaging results that were available during my care of the patient were reviewed by me and considered in my medical decision making (see chart for details).     Discussed concern for hypertensive emergency given severely elevated blood pressure readings today and current symptoms. Discussed that we are unable to obtain imaging for further evaluation and recommended patient be evaluated in the ER. He is agreeable and will go directly to the ER following visit. Patient was hemodynamically stable for private transport.   Final Clinical Impressions(s) / UC Diagnoses   Final diagnoses:  Nonintractable headache, unspecified chronicity pattern, unspecified headache type  Numbness and tingling in both hands  Blurred vision, bilateral  Hypertension, unspecified type     Discharge Instructions     Go to the ER for further evaluation.     ED Prescriptions    None     PDMP not reviewed this encounter.   Jeani Hawking, PA-C 05/12/20 2100

## 2020-05-12 NOTE — ED Triage Notes (Addendum)
Pt reports hypertension, dizziness, headache x 3 days and also abscess on right side of mouth x 2 weeks. Pt states he is not diagnose with HTN. Pt denies any chest pain, sob, n/v/d. Pt states ETOH abuse for the past  2 weeks, drinks about 4-5 cans of beer   NIH in triage= 0

## 2020-05-12 NOTE — Discharge Instructions (Addendum)
Go to the ER for further evaluation

## 2020-05-12 NOTE — ED Provider Notes (Signed)
MSE was initiated and I personally evaluated the patient and placed orders (if any) at  10:23 PM on May 12, 2020.  The patient appears stable so that the remainder of the MSE may be completed by another provider.  Patient noticed that his blood pressure has been fluctuating for the past several days.  Furthermore also complaining of recurrent headache as well as having dental pain.  Was seen at urgent care earlier today but was recommended to come to the ER for further evaluation.  Patient does not exhibit any focal neuro deficit concerning for acute stroke.  Current blood pressure pressure is 130/90.  He is stable for further evaluation.   Fayrene Helper, PA-C 05/12/20 2225    Rolan Bucco, MD 05/12/20 2249

## 2020-05-13 NOTE — ED Notes (Signed)
Pt left due to not being seen quick enough 

## 2021-03-27 NOTE — Progress Notes (Unsigned)
NEUROLOGY FOLLOW UP OFFICE NOTE  DIEZEL MAZUR [id number]  Assessment/Plan:   1.  History of right PCA infarct, cryptogenic - I don't appreciate any gross visual field defect - recommended following up with eye doctor for visual field testing. 2.  Headache/Migraine - improved on nortriptyline 3.  Hypertension 4.  Tobacco use disorder   1.  Nortriptyline 25mg  at bedtime - monitor blood pressure 2.  Limit use of pain relievers to no more than 2 days out of week to prevent risk of rebound or medication-overuse headache. 3.  Secondary stroke prevention as per PCP: - ASA 81mg  daily - LDL goal less than 70 - Hgb A1c goal less than 7 - Blood pressure goal less than 130/90 - Mediterranean diet - Routine exercise - Smoking cessation 4.  Follow up one year  Subjective:  Hector Wilcox. Drab is a 46 year old right-handed male with migraine, sickle cell trait who follows up for stroke.   UPDATE: Current medications:  ASA 81mg  daily, atorvastatin 40mg , nortriptyline 25mg  at bedtime    Still has slurred speech and change in vision at times.  Sometimes left arm feels numb.  Headaches are infrequent.     HISTORY: He was admitted to [facility] in February 2020 after presenting with headache and blurred vision.  Neurologic exam demonstrated left upper quandrantanopsia.  CT head showed right PCA infarct, which was confirmed on MRI of brain.  CTA of head and neck showed no large vessel occlusion or stenosis.  LDL was 86 and Hgb A1c 5.9.  UDS negative.  2D echocardiogram was unremarkable.  TEE showed EF 50-55% with no cardiac source of embolus .  He was discharged on ASA and Plavix and Lipitor 40mg  daily.  He didn't follow up with outpatient neurology due to lack of insurance.  He established care with PCP in May.  At the time, he was still taking dual antiplatelet therapy, so Plavix was discontinued and he was advised to continue ASA.   Since then, he reports that his eyes have to adjust when  he gets up in the morning.  He continued to have headaches.  It is a mild right sided pressure (sometimes pounding) that radiates either to the back of the head or across the front to the left temple that lasts about 30 minutes with Advil, Aleve or Tylenol.  There is associated photophobia, phonophobia and sometimes nausea.  They occur 3 to 4 times a week.  In 2021, he endorsed slurred speech and feels unsteady on feet.  MRI of brain without contrast was performed on 10/16/2019 showed chronic right PCA infarct but no acute findings.   PAST MEDICAL HISTORY: Past Medical History:  Diagnosis Date   Acid reflux    Constipation    Hypocalcemia    Muscle spasm    Sickle cell trait (HCC)    Stroke (cerebrum) (HCC) 02/2018   Tooth abscess 11/2018   Tooth pain 11/2018    MEDICATIONS: Current Outpatient Medications on File Prior to Visit  Medication Sig Dispense Refill   aspirin EC 81 MG tablet Take 1 tablet (81 mg total) by mouth daily. Take Plavix 75 mg daily + Aspirin 81 mg with Food Daily For 3 weeks , then STOP Plavix and Take Aspirin only 30 tablet 11   atorvastatin (LIPITOR) 40 MG tablet Take 1 tablet (40 mg total) by mouth every evening. 30 tablet 5   bismuth subsalicylate (PEPTO-BISMOL) 262 MG/15ML suspension Take 30 mLs by mouth 4 (four) times  daily -  before meals and at bedtime. 1700 mL 0   cetirizine (ZYRTEC) 10 MG tablet Take 10 mg by mouth daily as needed for allergies.     famotidine (PEPCID) 20 MG tablet Take 1 tablet (20 mg total) by mouth 2 (two) times daily. 60 tablet 6   folic acid (FOLVITE) 1 MG tablet Take 1 tablet (1 mg total) by mouth daily. 30 tablet 5   Multiple Vitamin (MULTIVITAMIN WITH MINERALS) TABS tablet Take 1 tablet by mouth daily. 30 tablet 5   nortriptyline (PAMELOR) 25 MG capsule Take 1 capsule (25 mg total) by mouth at bedtime. 30 capsule 5   omeprazole (PRILOSEC) 40 MG capsule Take 1 capsule (40 mg total) by mouth daily. 30 capsule 0   sucralfate (CARAFATE) 1 g  tablet Take 1 tablet (1 g total) by mouth 4 (four) times daily -  with meals and at bedtime. 120 tablet 2   No current facility-administered medications on file prior to visit.    ALLERGIES: Allergies  Allergen Reactions   Other Swelling    Tomatoes Onions  *swelling in the mouth*   Tramadol Swelling    lips    FAMILY HISTORY: Family History  Problem Relation Age of Onset   Heart failure Mother       Objective:  *** General: No acute distress.  Patient appears well-groomed.   Head:  Normocephalic/atraumatic Eyes:  Fundi examined but not visualized Neck: supple, no paraspinal tenderness, full range of motion Heart:  Regular rate and rhythm Lungs:  Clear to auscultation bilaterally Back: No paraspinal tenderness Neurological Exam: alert and oriented to person, place, and time.  Speech fluent and not dysarthric, language intact.  CN II-XII intact. Bulk and tone normal, muscle strength 5/5 throughout.  Pinprick and vibration sensation reduced in left upper and lower extremities..  Deep tendon reflexes  3+ left upper extremity, otherwise 2+ throughout. Marland Kitchen  Deep tendon reflexes 2+ throughout, toes downgoing.  Finger to nose testing intact.  Gait normal, Romberg negative.   Shon Millet, DO  CC: Raenette Rover, PA-C

## 2021-03-29 ENCOUNTER — Ambulatory Visit: Payer: BLUE CROSS/BLUE SHIELD | Admitting: Neurology

## 2023-09-16 ENCOUNTER — Emergency Department (HOSPITAL_COMMUNITY)
Admission: EM | Admit: 2023-09-16 | Discharge: 2023-09-16 | Disposition: A | Discharge status: Home / Self Care | Payer: Self-pay | Attending: Emergency Medicine | Admitting: Emergency Medicine

## 2023-09-16 ENCOUNTER — Emergency Department (HOSPITAL_COMMUNITY): Payer: Self-pay

## 2023-09-16 ENCOUNTER — Other Ambulatory Visit: Payer: Self-pay

## 2023-09-16 ENCOUNTER — Encounter (HOSPITAL_COMMUNITY): Payer: Self-pay

## 2023-09-16 DIAGNOSIS — Z720 Tobacco use: Secondary | ICD-10-CM | POA: Diagnosis not present

## 2023-09-16 DIAGNOSIS — S022XXA Fracture of nasal bones, initial encounter for closed fracture: Secondary | ICD-10-CM | POA: Diagnosis not present

## 2023-09-16 DIAGNOSIS — S0993XA Unspecified injury of face, initial encounter: Secondary | ICD-10-CM

## 2023-09-16 DIAGNOSIS — S0285XA Fracture of orbit, unspecified, initial encounter for closed fracture: Secondary | ICD-10-CM

## 2023-09-16 DIAGNOSIS — S02832A Fracture of medial orbital wall, left side, initial encounter for closed fracture: Secondary | ICD-10-CM | POA: Diagnosis not present

## 2023-09-16 DIAGNOSIS — S0240DA Maxillary fracture, left side, initial encounter for closed fracture: Secondary | ICD-10-CM | POA: Diagnosis not present

## 2023-09-16 DIAGNOSIS — S0990XA Unspecified injury of head, initial encounter: Secondary | ICD-10-CM | POA: Diagnosis present

## 2023-09-16 DIAGNOSIS — Z7982 Long term (current) use of aspirin: Secondary | ICD-10-CM | POA: Diagnosis not present

## 2023-09-16 MED ORDER — POLYMYXIN B-TRIMETHOPRIM 10000-0.1 UNIT/ML-% OP SOLN
1.0000 [drp] | OPHTHALMIC | 0 refills | Status: AC
  Filled 2023-09-16: qty 10, 25d supply, fill #0

## 2023-09-16 MED ORDER — OXYCODONE-ACETAMINOPHEN 5-325 MG PO TABS
1.0000 | ORAL_TABLET | Freq: Once | ORAL | Status: AC
  Administered 2023-09-16: 1 via ORAL
  Filled 2023-09-16: qty 1
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
## 2023-09-16 NOTE — ED Notes (Signed)
 Patient transported to CT
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
## 2023-09-17 ENCOUNTER — Other Ambulatory Visit: Payer: Self-pay
